# Patient Record
Sex: Male | Born: 1985 | Race: Black or African American | Hispanic: No | Marital: Single | Smoking: Former smoker
Health system: Southern US, Community
[De-identification: ages and names within clinical notes are randomized; demographics above are authoritative.]

## PROBLEM LIST (undated history)

## (undated) DIAGNOSIS — N189 Chronic kidney disease, unspecified: Secondary | ICD-10-CM

## (undated) DIAGNOSIS — K649 Unspecified hemorrhoids: Secondary | ICD-10-CM

## (undated) DIAGNOSIS — G40909 Epilepsy, unspecified, not intractable, without status epilepticus: Secondary | ICD-10-CM

## (undated) DIAGNOSIS — Z21 Asymptomatic human immunodeficiency virus [HIV] infection status: Secondary | ICD-10-CM

## (undated) HISTORY — DX: Unspecified hemorrhoids: K64.9

## (undated) HISTORY — DX: Asymptomatic human immunodeficiency virus (hiv) infection status: Z21

## (undated) HISTORY — DX: Epilepsy, unspecified, not intractable, without status epilepticus: G40.909

---

## 2019-05-11 ENCOUNTER — Other Ambulatory Visit: Payer: Self-pay | Admitting: General Surgery

## 2019-05-11 ENCOUNTER — Other Ambulatory Visit (HOSPITAL_COMMUNITY): Payer: Self-pay | Admitting: General Surgery

## 2019-05-28 ENCOUNTER — Encounter: Payer: Self-pay | Admitting: Neurology

## 2019-05-28 ENCOUNTER — Ambulatory Visit (INDEPENDENT_AMBULATORY_CARE_PROVIDER_SITE_OTHER): Payer: Commercial Managed Care - PPO | Admitting: Neurology

## 2019-05-28 ENCOUNTER — Other Ambulatory Visit: Payer: Self-pay

## 2019-05-28 VITALS — BP 150/94 | HR 88 | Ht 65.0 in | Wt 246.0 lb

## 2019-05-28 DIAGNOSIS — R351 Nocturia: Secondary | ICD-10-CM

## 2019-05-28 DIAGNOSIS — R0683 Snoring: Secondary | ICD-10-CM

## 2019-05-28 DIAGNOSIS — Z820 Family history of epilepsy and other diseases of the nervous system: Secondary | ICD-10-CM

## 2019-05-28 DIAGNOSIS — Z9189 Other specified personal risk factors, not elsewhere classified: Secondary | ICD-10-CM | POA: Diagnosis not present

## 2019-05-28 DIAGNOSIS — B20 Human immunodeficiency virus [HIV] disease: Secondary | ICD-10-CM

## 2019-05-28 DIAGNOSIS — Z6841 Body Mass Index (BMI) 40.0 and over, adult: Secondary | ICD-10-CM

## 2019-05-28 NOTE — Progress Notes (Signed)
Subjective:    Patient ID: Danel Requena is a 33 y.o. male.  HPI     Huston Foley, MD, PhD [facility] [address], [address] [address] [place], [place] [address]  Dear Dr. Sheliah Hatch,  I saw your patient, Rembert Browe, upon your kind request in my sleep clinic today for initial consultation of his, in particular, concern for underlying obstructive sleep apnea.  The patient is unaccompanied today.  As you know, Mr. Senters is a 33 year old right-handed gentleman with an underlying medical history of hypertension, HIV disease, and morbid obesity with a BMI of over 40, who reports snoring and a family history of obstructive sleep apnea.  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  He tries to achieve 7 to 8 hours of sleep.  His bedtime and rise time are variable.  He lives with a roommate.  He has significant nocturia, reports that he takes his blood pressure medication and hydrochlorothiazide at night.  He gets up to use the bathroom 4 or 5 times per average night.  I reviewed your office note from 05/09/2019, which you kindly included.  He is being evaluated for bariatric surgery.  His Epworth sleepiness score is 9 out of 24, fatigue severity score is 14 out of 63.  He denies morning headaches.  His father has sleep apnea and had a CPAP machine.  His maternal uncle has sleep apnea as well.  The patient is a non-smoker, drinks caffeine and limitation, coffee 1 cup and tea 1 cup generally speaking, alcohol occasionally in the form of wine.  He is single, no kids, no pets in the household.  He has a TV in his bedroom but turns it off at night.  His Past Medical History Is Significant For: Past Medical History:  Diagnosis Date  . Hemorrhoids   . HIV positive (HCC)   . Seizure disorder [facility])     His Past Surgical History Is Significant For: History reviewed. No pertinent surgical  history.  His Family History Is Significant For: Family History  Problem Relation Age of Onset  . Hypertension Mother   . Hypertension Father   . Diabetes Father     His Social History Is Significant For: Social History   Socioeconomic History  . Marital status: Single    Spouse name: Not on file  . Number of children: Not on file  . Years of education: Not on file  . Highest education level: Not on file  Occupational History  . Not on file  Social Needs  . Financial resource strain: Not on file  . Food insecurity    Worry: Not on file    Inability: Not on file  . Transportation needs    Medical: Not on file    Non-medical: Not on file  Tobacco Use  . Smoking status: Former Games developer  . Smokeless tobacco: Never Used  Substance and Sexual Activity  . Alcohol use: Yes  . Drug use: Not on file  . Sexual activity: Not on file  Lifestyle  . Physical activity    Days per week: Not on file    Minutes per session: Not on file  . Stress: Not on file  Relationships  . Social Musician on phone: Not on file    Gets together: Not on file    Attends religious service: Not on file  Active member of club or organization: Not on file    Attends meetings of clubs or organizations: Not on file    Relationship status: Not on file  Other Topics Concern  . Not on file  Social History Narrative  . Not on file    His Allergies Are:  Allergies  Allergen Reactions  . Bactrim [Sulfamethoxazole-Trimethoprim]   . Shellfish Allergy   :   His Current Medications Are:  Outpatient Encounter Medications as of 05/28/2019  Medication Sig  . bictegravir-emtricitabine-tenofovir AF (BIKTARVY) 50-200-25 MG TABS tablet Take 1 tablet by mouth daily.  . hydrochlorothiazide (HYDRODIURIL) 12.5 MG tablet Take 12.5 mg by mouth daily.  Marland Kitchen. lisinopril (ZESTRIL) 40 MG tablet Take 40 mg by mouth daily.   No facility-administered encounter medications on file as of 05/28/2019.   :  Review  of Systems:  Out of a complete 14 point review of systems, all are reviewed and negative with the exception of these symptoms as listed below: Review of Systems  Neurological:       Pt presents today to discuss his sleep. Pt has never had a sleep study but does endorse snoring.  Epworth Sleepiness Scale 0= would never doze 1= slight chance of dozing 2= moderate chance of dozing 3= high chance of dozing  Sitting and reading: 2 Watching TV: 1 Sitting inactive in a public place (ex. Theater or meeting): 0 As a passenger in a car for an hour without a break: 2 Lying down to rest in the afternoon: 3 Sitting and talking to someone: 0 Sitting quietly after lunch (no alcohol): 1 In a car, while stopped in traffic: 0 Total: 9     Objective:  Neurological Exam  Physical Exam Physical Examination:   Vitals:   05/28/19 0859  BP: (!) 150/94  Pulse: 88    General Examination: The patient is a very pleasant 33 y.o. male in no acute distress. He appears well-developed and well-nourished and well groomed.   HEENT: Normocephalic, atraumatic, pupils are equal, round and reactive to light.  He has corrective eyeglasses in place, extraocular tracking is well preserved, hearing is grossly intact.  Face is symmetric with normal facial animation. Speech is clear with no dysarthria noted. There is no hypophonia. There is no lip, neck/head, jaw or voice tremor. Neck is supple with full range of passive and active motion. There are no carotid bruits on auscultation. Oropharynx exam reveals: mild mouth dryness, good dental hygiene and moderate airway crowding, due to Tonsillar size of 2+, Mallampati is class I, uvula slightly larger, normal tongue, neck circumference 18-3/8 inches.  He has a minimal overbite.  Tongue protrudes centrally in palate elevates symmetrically.  Chest: Clear to auscultation without wheezing, rhonchi or crackles noted.  Heart: S1+S2+0, regular and normal without murmurs, rubs  or gallops noted.   Abdomen: Soft, non-tender and non-distended with normal bowel sounds appreciated on auscultation.  Extremities: There is no pitting edema in the distal lower extremities bilaterally.  Skin: Warm and dry without trophic changes noted.  Musculoskeletal: exam reveals no obvious joint deformities, tenderness or joint swelling or erythema.   Neurologically:  Mental status: The patient is awake, alert and oriented in all 4 spheres. His immediate and remote memory, attention, language skills and fund of knowledge are appropriate. There is no evidence of aphasia, agnosia, apraxia or anomia. Speech is clear with normal prosody and enunciation. Thought process is linear. Mood is normal and affect is normal.  Cranial nerves II - XII are  as described above under HEENT exam.  Motor exam: Normal bulk, strength and tone is noted. There is no tremor. Romberg is negative. Fine motor skills and coordination: grossly intact with normal finger taps, normal hand movements, normal rapid alternating patting, normal foot taps and normal foot agility.  Cerebellar testing: No dysmetria or intention tremor. There is no truncal or gait ataxia.  Sensory exam: intact to light touch in the upper and lower extremities.  Gait, station and balance: He stands easily. No veering to one side is noted. No leaning to one side is noted. Posture is age-appropriate and stance is narrow based. Gait shows normal stride length and normal pace. No problems turning are noted. Tandem walk is unremarkable.               Assessment and Plan:  In summary, Dom Haverland is a very pleasant 33 y.o.-year old male with an underlying medical history of hypertension, HIV disease, and morbid obesity with a BMI of over 40, whose history and physical exam are concerning for obstructive sleep apnea (OSA). I had a long chat with the patient about my findings and the diagnosis of OSA, its prognosis and treatment options. We talked about  medical treatments, surgical interventions and non-pharmacological approaches. I explained in particular the risks and ramifications of untreated moderate to severe OSA, especially with respect to developing cardiovascular disease down the Road, including congestive heart failure, difficult to treat hypertension, cardiac arrhythmias, or stroke. Even type 2 diabetes has, in part, been linked to untreated OSA. Symptoms of untreated OSA include daytime sleepiness, memory problems, mood irritability and mood disorder such as depression and anxiety, lack of energy, as well as recurrent headaches, especially morning headaches. We talked about trying to maintain a healthy lifestyle in general, as well as the importance of weight control. We also talked about the importance of good sleep hygiene. I recommended the following at this time: sleep study.   I explained the sleep test procedure to the patient and also outlined possible surgical and non-surgical treatment options of OSA (which would require a referral to a specialist dentist or oral surgeon), upper airway surgical options, such as traditional UPPP or a novel less invasive surgical option in the form of Inspire hypoglossal nerve stimulation (which would involve a referral to an ENT surgeon). I also explained the CPAP treatment option to the patient, who indicated that he would be willing to try CPAP if the need arises. I explained the importance of being compliant with PAP treatment, not only for insurance purposes but primarily to improve His symptoms, and for the patient's long term health benefit, including to reduce His cardiovascular risks. I answered all his questions today and the patient was in agreement. I plan to see him back after the sleep study is completed and encouraged him to call with any interim questions, concerns, problems or updates.   Thank you very much for allowing me to participate in the care of this nice patient. If I can be of any  further assistance to you please do not hesitate to call me at [phone].  Sincerely,   Star Age, MD, PhD

## 2019-05-28 NOTE — Patient Instructions (Signed)

## 2019-05-29 ENCOUNTER — Ambulatory Visit (HOSPITAL_COMMUNITY)
Admission: RE | Admit: 2019-05-29 | Discharge: 2019-05-29 | Disposition: A | Discharge status: Home / Self Care | Payer: Commercial Managed Care - PPO | Source: Ambulatory Visit | Attending: General Surgery | Admitting: General Surgery

## 2019-05-29 ENCOUNTER — Other Ambulatory Visit: Payer: Self-pay

## 2019-06-20 ENCOUNTER — Ambulatory Visit (INDEPENDENT_AMBULATORY_CARE_PROVIDER_SITE_OTHER): Payer: Commercial Managed Care - PPO | Admitting: Neurology

## 2019-06-20 ENCOUNTER — Encounter: Payer: Self-pay | Admitting: Dietician

## 2019-06-20 ENCOUNTER — Other Ambulatory Visit: Payer: Self-pay

## 2019-06-20 ENCOUNTER — Encounter: Payer: Commercial Managed Care - PPO | Attending: General Surgery | Admitting: Dietician

## 2019-06-20 DIAGNOSIS — B20 Human immunodeficiency virus [HIV] disease: Secondary | ICD-10-CM

## 2019-06-20 DIAGNOSIS — R0683 Snoring: Secondary | ICD-10-CM

## 2019-06-20 DIAGNOSIS — Z9189 Other specified personal risk factors, not elsewhere classified: Secondary | ICD-10-CM

## 2019-06-20 DIAGNOSIS — R351 Nocturia: Secondary | ICD-10-CM

## 2019-06-20 DIAGNOSIS — G4733 Obstructive sleep apnea (adult) (pediatric): Secondary | ICD-10-CM | POA: Diagnosis not present

## 2019-06-20 DIAGNOSIS — Z820 Family history of epilepsy and other diseases of the nervous system: Secondary | ICD-10-CM

## 2019-06-20 DIAGNOSIS — E669 Obesity, unspecified: Secondary | ICD-10-CM | POA: Diagnosis present

## 2019-06-20 NOTE — Progress Notes (Signed)
Nutrition Assessment for Bariatric Surgery Medical Nutrition Therapy  Appt Start Time: 11:00am    End Time: 11:45am  Patient was seen on 06/20/2019 for Pre-Operative Nutrition Assessment. Letter of approval faxed to [facility] bariatric surgery program coordinator on 06/20/2019.   Referral stated Supervised Weight Loss (SWL) visits needed: 0  Planned surgery: Sleeve  Pt expectation of surgery: to lower blood pressure, lose weight, and prevent future health problems  Pt expectation of dietitian: educate on making healthier food choices    NUTRITION ASSESSMENT   Anthropometrics  Start weight at [facility]: 246.5 lbs (date: 06/20/2019) Height: 65 in BMI: 41 kg/m2    Clinical  Medical hx: obesity, HTN, hemorrhoids, HIV, seizure disorder Medications: biktarvy, lisinopril, hydrochlorothiazide     Lifestyle & Dietary Hx Patient works as a Catering manager. Lives with his significant other. States he has always been "husky" or above average for weight despite being active when younger, and would like to prevent future health complications (such as diabetes which is in his family.) Mentioned he has a brother and sister.   Typical meal pattern is 3 meals plus snacks every day. Likes sweets (especially airplane cookies) and potato chips. States he would like to completely change his eating habits and diet. As a flight attendant, lunch/dinner is usually something from an airport (fast food such as burger and fries) or ordering pizza in a hotel. States he has seen other people be successful with this surgery and eating healthy as a flight attendant and he would like to work on this such as meal prepping.   24-Hr Dietary Recall First Meal: oatmeal (or grits) + tea  Snack: potato chips  Second Meal: airport meal   Snack: airplane cookies  Third Meal: pizza  Snack: sweets  Beverages: tea, water, used to drink soda and juice   Estimated Energy Needs Calories: 2000 Carbohydrate:  225g Protein: 125g Fat: 67g   NUTRITION DIAGNOSIS  Overweight/obesity ([place]-3.3) related to past poor dietary habits and physical inactivity as evidenced by patient w/ planned Sleeve Gastrectomy surgery following dietary guidelines for continued weight loss.    NUTRITION INTERVENTION  Nutrition counseling (C-1) and education (E-2) to facilitate bariatric surgery goals.  Pre-Op Goals Reviewed with the Patient . Track food and beverage intake (pen and paper, MyFitness Pal, Baritastic app, etc.) . Make healthy food choices while monitoring portion sizes . Consume 3 meals per day or try to eat every 3-5 hours . Avoid concentrated sugars and fried foods . Keep sugar & fat in the single digits per serving on food labels . Practice CHEWING your food (aim for applesauce consistency) . Practice not drinking 15 minutes before, during, and 30 minutes after each meal and snack . Avoid all carbonated beverages (ex: soda, sparkling beverages)  . Limit caffeinated beverages (ex: coffee, tea, energy drinks) . Avoid all sugar-sweetened beverages (ex: regular soda, sports drinks)  . Avoid alcohol  . Aim for 64-100 ounces of FLUID daily (with at least half of fluid intake being plain water)  . Aim for at least 60-80 grams of PROTEIN daily . Look for a liquid protein source that contains ?15 g protein and ?5 g carbohydrate (ex: shakes, drinks, shots) . Make a list of non-food related activities . Physical activity is an important part of a healthy lifestyle so keep it moving! The goal is to reach 150 minutes of exercise per week, including cardiovascular and weight baring activity.  Handouts Provided Include  . Bariatric Surgery handouts (Nutrition Visits, Pre-Op Goals,  Protein Shakes, Vitamins & Minerals)  Learning Style & Readiness for Change Teaching method utilized: Visual & Auditory  Demonstrated degree of understanding via: Teach Back  Barriers to learning/adherence to lifestyle change: None  Identified   MONITORING & EVALUATION Dietary intake, weekly physical activity, body weight, and pre-op goals reached at next nutrition visit.   Next Steps Patient is to call [facility] to enroll in Pre-Op Class (>2 weeks before surgery) and Post-Op Class (2 weeks after surgery) for further nutrition education when surgery date is scheduled.

## 2019-06-21 ENCOUNTER — Encounter: Payer: Self-pay | Admitting: Neurology

## 2019-07-05 ENCOUNTER — Telehealth: Payer: Self-pay

## 2019-07-05 NOTE — Progress Notes (Signed)
Patient referred by Dr. Kieth Brightly, seen by me on 05/28/19, HST on 06/20/19:  Please call and notify the patient that the recent home sleep test did suggest the diagnosis of severe obstructive sleep apnea and that I recommend treatment for this in the form of CPAP. I will request an overnight sleep study for proper titration and mask fitting. Please explain to patient.  Ethan Age, MD, PhD [facility])

## 2019-07-05 NOTE — Addendum Note (Signed)
Addended by: Star Age on: 07/05/2019 01:20 PM   Modules accepted: Orders

## 2019-07-05 NOTE — Telephone Encounter (Signed)
-----   Message from Star Age, MD sent at 07/05/2019  1:20 PM EST ----- Patient referred by Dr. Kieth Brightly, seen by me on 05/28/19, HST on 06/20/19:  Please call and notify the patient that the recent home sleep test did suggest the diagnosis of severe obstructive sleep apnea and that I recommend treatment for this in the form of CPAP. I will request an overnight sleep study for proper titration and mask fitting. Please explain to patient.  Star Age, MD, PhD [facility])

## 2019-07-05 NOTE — Procedures (Signed)
[facility] [place]  Patient Information     First Name: Ethan Hansen Last Name: Tramell Piechota: [id number]  Birth Date: [birth year] Age: 33 Gender: Male  Referring Provider: Mickeal Skinner, MD BMI: 41.1 (W=247 lb, H=5' 5'')  Neck Circ.:  18 '' Epworth:  9/24   Sleep Study Information    Study Date: 06/20/2019 S/H/A Version: 001.001.001.001 / 4.1.1528 / 79  History:    33 year old man with a history of hypertension, HIV disease, and morbid obesity with a BMI of over 40, who reports snoring and a family history of obstructive sleep apnea.  Summary & Diagnosis:    Severe OSA  Recommendations:      This home sleep test demonstrates severe obstructive sleep apnea with a total AHI of 52.8/hour and O2 nadir of 72%. Given the patient's medical history and sleep related complaints, treatment with positive airway pressure (in the form of CPAP) is recommended. This will require a full night CPAP titration study for proper treatment settings, O2 monitoring and mask fitting. Based on the severity of the sleep disordered breathing an attended titration study is indicated. Please note that untreated obstructive sleep apnea may carry additional perioperative morbidity. Patients with significant obstructive sleep apnea should receive perioperative PAP therapy and the surgeons and particularly the anesthesiologist should be informed of the diagnosis and the severity of the sleep disordered breathing. The patient should be cautioned not to drive, work at heights, or operate dangerous or heavy equipment when tired or sleepy. Review and reiteration of good sleep hygiene measures should be pursued with any patient. Other causes of the patient's symptoms, including circadian rhythm disturbances, an underlying mood disorder, medication effect and/or an underlying medical problem cannot be ruled out based on this test. Clinical correlation is recommended. The patient and his referring provider will be notified of the test  results. The patient will be seen in follow up in sleep clinic at [facility].  I certify that I have reviewed the raw data recording prior to the issuance of this report in accordance with the standards of the American Academy of Sleep Medicine (AASM).  Star Age, MD, PhD [facility]) Diplomat, ABPN (Neurology and Sleep)                             Sleep Summary    Oxygen Saturation Statistics     Start Study Time: End Study Time: Total Recording Time:  9:36:50 PM 7:40:51 AM 10 h, 4 min  Total Sleep Time % REM of Sleep Time:  8 h, 13 min  31.2    Mean: 98 Minimum: 72 Maximum: 100  Mean of Desaturations Nadirs (%):   93  Oxygen Desaturation. %:   4-9 10-20 >20 Total  Events Number Total   214  11 93.4 4.8  4 1.7  229 100.0  Oxygen Saturation <90 <=88 <85 <80 <70  Duration (minutes): Sleep % 3.2 0.6  2.3 1.0  0.5 0.2 0.2 0.0 0.0 0.0     Respiratory Indices      Total Events REM NREM All Night  pRDI:  494  pAHI:  433 ODI:  229  pAHIc:  20  % CSR: 0.0 74.2 68.3 47.9 3.9 54.0 45.8 18.9 1.8 60.3 52.8 27.9 2.4       Pulse Rate Statistics during Sleep (BPM)      Mean:  67 Minimum: 40 Maximum: 162    Indices are calculated using technically  valid sleep time of  8 hrs, 11 min. pRDI/pAHI are calculated using oxi desaturations ? 3%                      Body Position Statistics  Position Supine Prone Right Left Non-Supine  Sleep (min) 183.0 48.0 249.0 12.5 309.5  Sleep % 37.1 9.7 50.5 2.5 62.7  pRDI 59.5 64.7 62.3 19.2 60.9  pAHI 51.6 64.7 53.3 19.2 53.7  ODI 29.6 34.3 27.0 0.0 27.0     Snoring Statistics Snoring Level (dB) >40 >50 >60 >70 >80 >Threshold (45)  Sleep (min) 368.0 23.5 2.5 0.0 0.0 88.7  Sleep % 74.6 4.8 0.5 0.0 0.0 18.0    Mean: 43 dB Sleep Stages Chart                                                                                pAHI=52.8                                                                                             Mild              Moderate                    Severe                                                 5              15                    30  * Reference values are according to AASM guidelines

## 2019-07-05 NOTE — Telephone Encounter (Signed)

## 2019-08-13 ENCOUNTER — Other Ambulatory Visit: Payer: Self-pay

## 2019-08-13 ENCOUNTER — Encounter: Payer: Commercial Managed Care - PPO | Attending: General Surgery | Admitting: Skilled Nursing Facility1

## 2019-08-13 DIAGNOSIS — E669 Obesity, unspecified: Secondary | ICD-10-CM | POA: Insufficient documentation

## 2019-08-13 NOTE — Progress Notes (Signed)
Pre-Operative Nutrition Class:  Appt start time: 2122   End time:  1830.  Patient was seen on 08/13/2019 for Pre-Operative Bariatric [facility]   Surgery date:  Surgery type: sleeve Start weight at [facility]: 246.5 Weight today: 246.7  Samples given per MNT protocol. Patient educated on appropriate usage: procare Multivitamin Lot # [id number] Exp: 05/22  The following the learning objectives were met by the patient during this course:  Identify Pre-Op Dietary Goals and will begin 2 weeks pre-operatively  Identify appropriate sources of fluids and proteins   State protein recommendations and appropriate sources pre and post-operatively  Identify Post-Operative Dietary Goals and will follow for 2 weeks post-operatively  Identify appropriate multivitamin and calcium sources  Describe the need for physical activity post-operatively and will follow MD recommendations  State when to call healthcare provider regarding medication questions or post-operative complications  Handouts given during class include:  Pre-Op Bariatric Surgery Diet Handout  Protein Shake Handout  Post-Op Bariatric Surgery Nutrition Handout  [facility] Program Information Flyer  Support Group Information Flyer  [facility] Bariatric Supplements Price List  Follow-Up Plan: Patient will follow-up at [facility] 2 weeks post operatively for diet advancement per MD.

## 2019-08-25 ENCOUNTER — Other Ambulatory Visit (HOSPITAL_COMMUNITY): Payer: Commercial Managed Care - PPO

## 2019-09-03 ENCOUNTER — Ambulatory Visit: Payer: Self-pay | Admitting: General Surgery

## 2019-09-10 ENCOUNTER — Inpatient Hospital Stay: Admit: 2019-09-10 | Payer: Commercial Managed Care - PPO | Admitting: General Surgery

## 2019-09-10 SURGERY — GASTRECTOMY, SLEEVE, LAPAROSCOPIC
Anesthesia: General

## 2019-10-09 ENCOUNTER — Ambulatory Visit: Payer: Self-pay | Admitting: General Surgery

## 2019-10-19 NOTE — Patient Instructions (Signed)
DUE TO COVID-19 ONLY ONE VISITOR IS ALLOWED TO COME WITH YOU AND STAY IN THE WAITING ROOM ONLY DURING PRE OP AND PROCEDURE DAY OF SURGERY. THE 1 VISITOR MAY VISIT WITH YOU AFTER SURGERY IN YOUR PRIVATE ROOM DURING VISITING HOURS ONLY!  YOU NEED TO HAVE A COVID 19 TEST ON_3/5______ @_2 :55 ( they close at 3pm)______, THIS TEST MUST BE DONE BEFORE SURGERY, COME  [address] , [id number].  ([place]) ONCE YOUR COVID TEST IS COMPLETED, PLEASE BEGIN THE QUARANTINE INSTRUCTIONS AS OUTLINED IN YOUR HANDOUT.                Ethan Hansen    Your procedure is scheduled on: 10/30/19   Report to [facility] Main  Entrance   Report to Short Stay at 5:30  AM     Call this number if you have problems the morning of surgery [place], NO CHEWING GUM [place].   Do not eat food After Midnight.   YOU MAY HAVE CLEAR LIQUIDS FROM MIDNIGHT UNTIL 4:30AM.   At 4:30AM Please finish the prescribed Pre-Surgery Gatorade drink.   Nothing by mouth after you finish the Gatorade drink !  Take these medicines the morning of surgery with A SIP OF WATER:   DO NOT TAKE ANY DIABETIC MEDICATIONS DAY OF YOUR SURGERY: Biktarvy. Bring your mask and tubing to the hospital with you.                               You may not have any metal on your body including             piercings  Do not wear jewelry, lotions, powders or deodorant                     Men may shave face and neck.   Do not bring valuables to the hospital. [place].  Contacts, dentures or bridgework may not be worn into surgery.     Special Instructions: N/A              Please read over the following fact sheets you were given: _____________________________________________________________________             [facility] - Preparing for Surgery  Before surgery, you can play an  important role .  Because skin is not sterile, your skin needs to be as free of germs as possible.   You can reduce the number of germs on your skin by washing with CHG (chlorahexidine gluconate) soap before surgery.   CHG is an antiseptic cleaner which kills germs and bonds with the skin to continue killing germs even after washing. Please DO NOT use if you have an allergy to CHG or antibacterial soaps.   If your skin becomes reddened/irritated stop using the CHG and inform your nurse when you arrive at Short Stay.   You may shave your face/neck.  Please follow these instructions carefully:  1.  Shower with CHG Soap the night before surgery and the  morning of Surgery.  2.  If you choose to wash your hair, wash your hair first as usual with your  normal  shampoo.  3.  After you shampoo,  rinse your hair and body thoroughly to remove the  shampoo.                                        4.  Use CHG as you would any other liquid soap.  You can apply chg directly  to the skin and wash                       Gently with a scrungie or clean washcloth.  5.  Apply the CHG Soap to your body ONLY FROM THE NECK DOWN.   Do not use on face/ open                           Wound or open sores. Avoid contact with eyes, ears mouth and genitals (private parts).                       Wash face,  Genitals (private parts) with your normal soap.             6.  Wash thoroughly, paying special attention to the area where your surgery  will be performed.  7.  Thoroughly rinse your body with warm water from the neck down.  8.  DO NOT shower/wash with your normal soap after using and rinsing off  the CHG Soap.             9.  Pat yourself dry with a clean towel.            10.  Wear clean pajamas.            11.  Place clean sheets on your bed the night of your first shower and do not  sleep with pets. Day of Surgery : Do not apply any lotions/deodorants the morning of surgery.  Please wear clean clothes to the  hospital/surgery center.  FAILURE TO FOLLOW THESE INSTRUCTIONS MAY RESULT IN THE CANCELLATION OF YOUR SURGERY PATIENT SIGNATURE_________________________________  NURSE SIGNATURE__________________________________  ________________________________________________________________________   Ethan Hansen  An incentive spirometer is a tool that can help keep your lungs clear and active. This tool measures how well you are filling your lungs with each breath. Taking long deep breaths may help reverse or decrease the chance of developing breathing (pulmonary) problems (especially infection) following:  A long period of time when you are unable to move or be active. BEFORE THE PROCEDURE   If the spirometer includes an indicator to show your best effort, your nurse or respiratory therapist will set it to a desired goal.  If possible, sit up straight or lean slightly forward. Try not to slouch.  Hold the incentive spirometer in an upright position. INSTRUCTIONS FOR USE  1. Sit on the edge of your bed if possible, or sit up as far as you can in bed or on a chair. 2. Hold the incentive spirometer in an upright position. 3. Breathe out normally. 4. Place the mouthpiece in your mouth and seal your lips tightly around it. 5. Breathe in slowly and as deeply as possible, raising the piston or the ball toward the top of the column. 6. Hold your breath for 3-5 seconds or for as long as possible. Allow the piston or ball to fall to the bottom of the column. 7. Remove the mouthpiece from your mouth and  breathe out normally. 8. Rest for a few seconds and repeat Steps 1 through 7 at least 10 times every 1-2 hours when you are awake. Take your time and take a few normal breaths between deep breaths. 9. The spirometer may include an indicator to show your best effort. Use the indicator as a goal to work toward during each repetition. 10. After each set of 10 deep breaths, practice coughing to be sure  your lungs are clear. If you have an incision (the cut made at the time of surgery), support your incision when coughing by placing a pillow or rolled up towels firmly against it. Once you are able to get out of bed, walk around indoors and cough well. You may stop using the incentive spirometer when instructed by your caregiver.  RISKS AND COMPLICATIONS  Take your time so you do not get dizzy or light-headed.  If you are in pain, you may need to take or ask for pain medication before doing incentive spirometry. It is harder to take a deep breath if you are having pain. AFTER USE  Rest and breathe slowly and easily.  It can be helpful to keep track of a log of your progress. Your caregiver can provide you with a simple table to help with this. If you are using the spirometer at home, follow these instructions: SEEK MEDICAL CARE IF:   You are having difficultly using the spirometer.  You have trouble using the spirometer as often as instructed.  Your pain medication is not giving enough relief while using the spirometer.  You develop fever of 100.5 F (38.1 C) or higher. SEEK IMMEDIATE MEDICAL CARE IF:   You cough up bloody sputum that had not been present before.  You develop fever of 102 F (38.9 C) or greater.  You develop worsening pain at or near the incision site. MAKE SURE YOU:   Understand these instructions.  Will watch your condition.  Will get help right away if you are not doing well or get worse. Document Released: 12/20/2006 Document Revised: 11/01/2011 Document Reviewed: 02/20/2007 [facility] Patient Information 2014 [place], [place].   ________________________________________________________________________

## 2019-10-22 ENCOUNTER — Encounter (HOSPITAL_COMMUNITY)
Admission: RE | Admit: 2019-10-22 | Discharge: 2019-10-22 | Disposition: A | Discharge status: Home / Self Care | Payer: Commercial Managed Care - PPO | Source: Ambulatory Visit | Attending: General Surgery | Admitting: General Surgery

## 2019-10-23 ENCOUNTER — Encounter (HOSPITAL_COMMUNITY): Payer: Self-pay

## 2019-10-23 ENCOUNTER — Encounter (HOSPITAL_COMMUNITY)
Admission: RE | Admit: 2019-10-23 | Discharge: 2019-10-23 | Disposition: A | Discharge status: Home / Self Care | Payer: Commercial Managed Care - PPO | Source: Ambulatory Visit | Attending: General Surgery | Admitting: General Surgery

## 2019-10-23 ENCOUNTER — Other Ambulatory Visit: Payer: Self-pay

## 2019-10-23 ENCOUNTER — Encounter (HOSPITAL_COMMUNITY): Payer: Commercial Managed Care - PPO

## 2019-10-23 DIAGNOSIS — I129 Hypertensive chronic kidney disease with stage 1 through stage 4 chronic kidney disease, or unspecified chronic kidney disease: Secondary | ICD-10-CM | POA: Insufficient documentation

## 2019-10-23 DIAGNOSIS — Z6841 Body Mass Index (BMI) 40.0 and over, adult: Secondary | ICD-10-CM | POA: Insufficient documentation

## 2019-10-23 DIAGNOSIS — Z01818 Encounter for other preprocedural examination: Secondary | ICD-10-CM | POA: Insufficient documentation

## 2019-10-23 DIAGNOSIS — G40909 Epilepsy, unspecified, not intractable, without status epilepticus: Secondary | ICD-10-CM | POA: Insufficient documentation

## 2019-10-23 DIAGNOSIS — Z79899 Other long term (current) drug therapy: Secondary | ICD-10-CM | POA: Diagnosis not present

## 2019-10-23 DIAGNOSIS — N189 Chronic kidney disease, unspecified: Secondary | ICD-10-CM | POA: Insufficient documentation

## 2019-10-23 DIAGNOSIS — Z21 Asymptomatic human immunodeficiency virus [HIV] infection status: Secondary | ICD-10-CM | POA: Diagnosis not present

## 2019-10-23 DIAGNOSIS — Z87891 Personal history of nicotine dependence: Secondary | ICD-10-CM | POA: Diagnosis not present

## 2019-10-23 HISTORY — DX: Chronic kidney disease, unspecified: N18.9

## 2019-10-23 LAB — COMPREHENSIVE METABOLIC PANEL
ALT: 25 U/L (ref 0–44)
AST: 24 U/L (ref 15–41)
Albumin: 4.2 g/dL (ref 3.5–5.0)
Alkaline Phosphatase: 85 U/L (ref 38–126)
Anion gap: 8 (ref 5–15)
BUN: 14 mg/dL (ref 6–20)
CO2: 26 mmol/L (ref 22–32)
Calcium: 9.2 mg/dL (ref 8.9–10.3)
Chloride: 102 mmol/L (ref 98–111)
Creatinine, Ser: 1.55 mg/dL — ABNORMAL HIGH (ref 0.61–1.24)
GFR calc Af Amer: >60 mL/min (ref >60)
GFR calc non Af Amer: 58 mL/min — ABNORMAL LOW (ref >60)
Glucose, Bld: 103 mg/dL — ABNORMAL HIGH (ref 70–99)
Potassium: 4 mmol/L (ref 3.5–5.1)
Sodium: 136 mmol/L (ref 135–145)
Total Bilirubin: 0.4 mg/dL (ref 0.3–1.2)
Total Protein: 8.6 g/dL — ABNORMAL HIGH (ref 6.5–8.1)

## 2019-10-23 LAB — CBC WITH DIFFERENTIAL/PLATELET
Abs Immature Granulocytes: 0.02 10*3/uL (ref 0.00–0.07)
Basophils Absolute: 0 10*3/uL (ref 0.0–0.1)
Basophils Relative: 1 %
Eosinophils Absolute: 0.3 10*3/uL (ref 0.0–0.5)
Eosinophils Relative: 5 %
HCT: 51 % (ref 39.0–52.0)
Hemoglobin: 16.2 g/dL (ref 13.0–17.0)
Immature Granulocytes: 0 %
Lymphocytes Relative: 36 %
Lymphs Abs: 2 10*3/uL (ref 0.7–4.0)
MCH: 26.8 pg (ref 26.0–34.0)
MCHC: 31.8 g/dL (ref 30.0–36.0)
MCV: 84.4 fL (ref 80.0–100.0)
Monocytes Absolute: 0.6 10*3/uL (ref 0.1–1.0)
Monocytes Relative: 10 %
Neutro Abs: 2.7 10*3/uL (ref 1.7–7.7)
Neutrophils Relative %: 48 %
Platelets: 259 10*3/uL (ref 150–400)
RBC: 6.04 MIL/uL — ABNORMAL HIGH (ref 4.22–5.81)
RDW: 15.2 % (ref 11.5–15.5)
WBC: 5.5 10*3/uL (ref 4.0–10.5)
nRBC: 0 % (ref 0.0–0.2)

## 2019-10-23 LAB — ABO/RH: ABO/RH(D): O POS

## 2019-10-23 NOTE — Progress Notes (Signed)
PCP -  Cardiologist -   Chest x-ray - 05/29/19 EKG - 05/29/19 Stress Test -  ECHO -  Cardiac Cath -   Sleep Study -  CPAP -   Fasting Blood Sugar -  Checks Blood Sugar _____ times a day  Blood Thinner Instructions: Aspirin Instructions: Last Dose:  Anesthesia review:   Patient denies shortness of breath, fever, cough and chest pain at PAT appointment   Patient verbalized understanding of instructions that were given to them at the PAT appointment. Patient was also instructed that they will need to review over the PAT instructions again at home before surgery.

## 2019-10-24 NOTE — Progress Notes (Signed)
Anesthesia Chart Review   Case: [id number] Date/Time: 10/30/19 0715   Procedure: LAPAROSCOPIC GASTRIC SLEEVE RESECTION, UPPER ENDO, ERAS Pathway (N/A )   Anesthesia type: General   Pre-op diagnosis: Morbid Obesity, HTN   Location: [facility] ROOM 01 / [facility]   Surgeons: Kinsinger, De Blanch, MD      DISCUSSION:33 y.o. former smoker with h/o CKD, seizure disorder (remote h/o 2 events, normal EEG), HIV positive, morbid obesity, HTN scheduled for above procedure 10/30/19 with Dr. Feliciana Rossetti.    Elevated BP at PAT visit, 146/113 and on recheck 148/107.  Pt reports he does not have a PCP in this area.  Currently taking hydrochlorothiazide.  Asymptomatic today.  Advised to be seen at UC/Family Med.  Pt reports he will be seen today for uncontrolled HTN.  Evaluate DOS.   VS: There were no vitals taken for this visit.  PROVIDERS: Patient, No Pcp Per   LABS: Labs reviewed: Acceptable for surgery. (all labs ordered are listed, but only abnormal results are displayed)  Labs Reviewed - No data to display   IMAGES:   EKG: 05/29/2019 Rate 93 bpm  Normal sinus rhythm   CV:  Past Medical History:  Diagnosis Date  . Chronic kidney disease   . Hemorrhoids   . HIV positive (HCC)   . Seizure disorder [facility])     History reviewed. No pertinent surgical history.  MEDICATIONS: . bictegravir-emtricitabine-tenofovir AF (BIKTARVY) 50-200-25 MG TABS tablet  . hydrochlorothiazide (HYDRODIURIL) 12.5 MG tablet  . lisinopril (ZESTRIL) 40 MG tablet  . Multiple Vitamins-Minerals (AIRBORNE PO)  . Multiple Vitamins-Minerals (EMERGEN-C IMMUNE PO)  . zinc gluconate 50 MG tablet   No current facility-administered medications for this encounter.   Janey Genta [facility] Pre-Surgical Testing [phone] 10/24/19  2:53 PM

## 2019-10-26 ENCOUNTER — Other Ambulatory Visit (HOSPITAL_COMMUNITY)
Admission: RE | Admit: 2019-10-26 | Discharge: 2019-10-26 | Disposition: A | Discharge status: Home / Self Care | Payer: Commercial Managed Care - PPO | Source: Ambulatory Visit | Attending: General Surgery | Admitting: General Surgery

## 2019-10-26 DIAGNOSIS — Z20822 Contact with and (suspected) exposure to covid-19: Secondary | ICD-10-CM | POA: Insufficient documentation

## 2019-10-26 DIAGNOSIS — Z01812 Encounter for preprocedural laboratory examination: Secondary | ICD-10-CM | POA: Insufficient documentation

## 2019-10-27 LAB — SARS CORONAVIRUS 2 (TAT 6-24 HRS): SARS Coronavirus 2: NEGATIVE

## 2019-10-29 MED ORDER — BUPIVACAINE LIPOSOME 1.3 % IJ SUSP
20.0000 mL | INTRAMUSCULAR | Status: DC
  Filled 2019-10-29: qty 20

## 2019-10-29 NOTE — Anesthesia Preprocedure Evaluation (Addendum)
Anesthesia Evaluation  Patient identified by MRN, date of birth, ID band Patient awake    Reviewed: Allergy & Precautions, NPO status , Patient's Chart, lab work & pertinent test results  Airway Mallampati: II  TM Distance: >3 FB Neck ROM: Full    Dental no notable dental hx.    Pulmonary former smoker,    Pulmonary exam normal breath sounds clear to auscultation       Cardiovascular hypertension, Normal cardiovascular exam Rhythm:Regular Rate:Normal  ECG: NSR, rate 93   Neuro/Psych Seizures -, Well Controlled,  negative psych ROS   GI/Hepatic negative GI ROS, Neg liver ROS,   Endo/Other  Morbid obesity  Renal/GU Renal InsufficiencyRenal disease     Musculoskeletal negative musculoskeletal ROS (+)   Abdominal (+) + obese,   Peds  Hematology  (+) HIV,   Anesthesia Other Findings Morbid Obesity  Reproductive/Obstetrics                            Anesthesia Physical Anesthesia Plan  ASA: III  Anesthesia Plan: General   Post-op Pain Management:    Induction: Intravenous  PONV Risk Score and Plan: 3 and Ondansetron, Dexamethasone, Midazolam and Treatment may vary due to age or medical condition  Airway Management Planned: Oral ETT  Additional Equipment:   Intra-op Plan:   Post-operative Plan: Extubation in OR  Informed Consent: I have reviewed the patients History and Physical, chart, labs and discussed the procedure including the risks, benefits and alternatives for the proposed anesthesia with the patient or authorized representative who has indicated his/her understanding and acceptance.     Dental advisory given  Plan Discussed with: CRNA  Anesthesia Plan Comments:        Anesthesia Quick Evaluation

## 2019-10-30 ENCOUNTER — Inpatient Hospital Stay (HOSPITAL_COMMUNITY): Payer: Commercial Managed Care - PPO | Admitting: Anesthesiology

## 2019-10-30 ENCOUNTER — Other Ambulatory Visit: Payer: Self-pay

## 2019-10-30 ENCOUNTER — Encounter (HOSPITAL_COMMUNITY)
Admission: RE | Disposition: A | Discharge status: Home / Self Care | Payer: Self-pay | Source: Home / Self Care | Attending: General Surgery

## 2019-10-30 ENCOUNTER — Inpatient Hospital Stay (HOSPITAL_COMMUNITY): Payer: Commercial Managed Care - PPO | Admitting: Physician Assistant

## 2019-10-30 ENCOUNTER — Encounter (HOSPITAL_COMMUNITY): Payer: Self-pay | Admitting: General Surgery

## 2019-10-30 ENCOUNTER — Inpatient Hospital Stay (HOSPITAL_COMMUNITY)
Admission: RE | Admit: 2019-10-30 | Discharge: 2019-10-31 | DRG: 621 | Disposition: A | Discharge status: Home / Self Care | Payer: Commercial Managed Care - PPO | Attending: General Surgery | Admitting: General Surgery

## 2019-10-30 DIAGNOSIS — G40909 Epilepsy, unspecified, not intractable, without status epilepticus: Secondary | ICD-10-CM | POA: Diagnosis present

## 2019-10-30 DIAGNOSIS — I129 Hypertensive chronic kidney disease with stage 1 through stage 4 chronic kidney disease, or unspecified chronic kidney disease: Secondary | ICD-10-CM | POA: Diagnosis present

## 2019-10-30 DIAGNOSIS — Z881 Allergy status to other antibiotic agents status: Secondary | ICD-10-CM

## 2019-10-30 DIAGNOSIS — Z79899 Other long term (current) drug therapy: Secondary | ICD-10-CM | POA: Diagnosis not present

## 2019-10-30 DIAGNOSIS — Z6841 Body Mass Index (BMI) 40.0 and over, adult: Secondary | ICD-10-CM | POA: Diagnosis not present

## 2019-10-30 DIAGNOSIS — N182 Chronic kidney disease, stage 2 (mild): Secondary | ICD-10-CM | POA: Diagnosis present

## 2019-10-30 DIAGNOSIS — Z87891 Personal history of nicotine dependence: Secondary | ICD-10-CM | POA: Diagnosis not present

## 2019-10-30 DIAGNOSIS — E669 Obesity, unspecified: Secondary | ICD-10-CM | POA: Diagnosis present

## 2019-10-30 DIAGNOSIS — Z21 Asymptomatic human immunodeficiency virus [HIV] infection status: Secondary | ICD-10-CM | POA: Diagnosis present

## 2019-10-30 DIAGNOSIS — Z87892 Personal history of anaphylaxis: Secondary | ICD-10-CM

## 2019-10-30 DIAGNOSIS — Z8249 Family history of ischemic heart disease and other diseases of the circulatory system: Secondary | ICD-10-CM

## 2019-10-30 DIAGNOSIS — Z91013 Allergy to seafood: Secondary | ICD-10-CM | POA: Diagnosis not present

## 2019-10-30 HISTORY — PX: LAPAROSCOPIC GASTRIC SLEEVE RESECTION: SHX5895

## 2019-10-30 LAB — TYPE AND SCREEN
ABO/RH(D): O POS
Antibody Screen: NEGATIVE

## 2019-10-30 LAB — SURGICAL PATHOLOGY

## 2019-10-30 LAB — HEMOGLOBIN AND HEMATOCRIT, BLOOD
HCT: 46.4 % (ref 39.0–52.0)
Hemoglobin: 15 g/dL (ref 13.0–17.0)

## 2019-10-30 SURGERY — GASTRECTOMY, SLEEVE, LAPAROSCOPIC
Anesthesia: General | Site: Abdomen

## 2019-10-30 MED ORDER — DEXTROSE-NACL 5-0.45 % IV SOLN: INTRAVENOUS | Status: DC

## 2019-10-30 MED ORDER — DEXAMETHASONE SODIUM PHOSPHATE 10 MG/ML IJ SOLN
INTRAMUSCULAR | Status: AC
  Filled 2019-10-30: qty 1

## 2019-10-30 MED ORDER — OXYCODONE HCL 5 MG/5ML PO SOLN
5.0000 mg | Freq: Four times a day (QID) | ORAL | Status: DC | PRN
  Administered 2019-10-30 – 2019-10-31 (×3): 5 mg via ORAL
  Filled 2019-10-30 (×3): qty 5

## 2019-10-30 MED ORDER — KETAMINE HCL 10 MG/ML IJ SOLN
INTRAMUSCULAR | Status: DC | PRN
  Administered 2019-10-30: 30 mg via INTRAVENOUS

## 2019-10-30 MED ORDER — PHENYLEPHRINE 40 MCG/ML (10ML) SYRINGE FOR IV PUSH (FOR BLOOD PRESSURE SUPPORT)
PREFILLED_SYRINGE | INTRAVENOUS | Status: AC
  Filled 2019-10-30: qty 10

## 2019-10-30 MED ORDER — GABAPENTIN 100 MG PO CAPS
200.0000 mg | ORAL_CAPSULE | Freq: Two times a day (BID) | ORAL | Status: DC
  Administered 2019-10-30 – 2019-10-31 (×3): 200 mg via ORAL
  Filled 2019-10-30 (×3): qty 2

## 2019-10-30 MED ORDER — PROPOFOL 10 MG/ML IV BOLUS
INTRAVENOUS | Status: DC | PRN
  Administered 2019-10-30: 200 mg via INTRAVENOUS

## 2019-10-30 MED ORDER — ONDANSETRON HCL 4 MG/2ML IJ SOLN
4.0000 mg | INTRAMUSCULAR | Status: DC | PRN
  Administered 2019-10-30: 4 mg via INTRAVENOUS
  Filled 2019-10-30: qty 2

## 2019-10-30 MED ORDER — FENTANYL CITRATE (PF) 250 MCG/5ML IJ SOLN
INTRAMUSCULAR | Status: AC
  Filled 2019-10-30: qty 5

## 2019-10-30 MED ORDER — ACETAMINOPHEN 500 MG PO TABS: 1000.0000 mg | ORAL_TABLET | Freq: Three times a day (TID) | ORAL | Status: DC

## 2019-10-30 MED ORDER — PHENYLEPHRINE 40 MCG/ML (10ML) SYRINGE FOR IV PUSH (FOR BLOOD PRESSURE SUPPORT)
PREFILLED_SYRINGE | INTRAVENOUS | Status: DC | PRN
  Administered 2019-10-30 (×2): 120 ug via INTRAVENOUS
  Administered 2019-10-30: 200 ug via INTRAVENOUS
  Administered 2019-10-30 (×2): 80 ug via INTRAVENOUS

## 2019-10-30 MED ORDER — OXYCODONE HCL 5 MG/5ML PO SOLN: 5.0000 mg | Freq: Once | ORAL | Status: DC | PRN

## 2019-10-30 MED ORDER — SODIUM CHLORIDE 0.9 % IV SOLN
2.0000 g | INTRAVENOUS | Status: AC
  Administered 2019-10-30: 2 g via INTRAVENOUS
  Filled 2019-10-30: qty 2

## 2019-10-30 MED ORDER — MIDAZOLAM HCL 5 MG/5ML IJ SOLN
INTRAMUSCULAR | Status: DC | PRN
  Administered 2019-10-30: 2 mg via INTRAVENOUS

## 2019-10-30 MED ORDER — SODIUM CHLORIDE (PF) 0.9 % IJ SOLN
INTRAMUSCULAR | Status: AC
  Filled 2019-10-30: qty 10

## 2019-10-30 MED ORDER — PHENYLEPHRINE HCL-NACL 10-0.9 MG/250ML-% IV SOLN
INTRAVENOUS | Status: DC | PRN
  Administered 2019-10-30: 50 ug/min via INTRAVENOUS

## 2019-10-30 MED ORDER — CHLORHEXIDINE GLUCONATE 4 % EX LIQD: 60.0000 mL | Freq: Once | CUTANEOUS | Status: DC

## 2019-10-30 MED ORDER — SUCCINYLCHOLINE CHLORIDE 200 MG/10ML IV SOSY
PREFILLED_SYRINGE | INTRAVENOUS | Status: AC
  Filled 2019-10-30: qty 10

## 2019-10-30 MED ORDER — STERILE WATER FOR IRRIGATION IR SOLN
Status: DC | PRN
  Administered 2019-10-30: 1000 mL

## 2019-10-30 MED ORDER — FENTANYL CITRATE (PF) 250 MCG/5ML IJ SOLN
INTRAMUSCULAR | Status: DC | PRN
  Administered 2019-10-30: 150 ug via INTRAVENOUS

## 2019-10-30 MED ORDER — GABAPENTIN 300 MG PO CAPS
300.0000 mg | ORAL_CAPSULE | ORAL | Status: AC
  Administered 2019-10-30: 300 mg via ORAL
  Filled 2019-10-30: qty 1

## 2019-10-30 MED ORDER — PROPOFOL 10 MG/ML IV BOLUS
INTRAVENOUS | Status: AC
  Filled 2019-10-30: qty 20

## 2019-10-30 MED ORDER — VASOPRESSIN 20 UNIT/ML IV SOLN
INTRAVENOUS | Status: AC
  Filled 2019-10-30: qty 1

## 2019-10-30 MED ORDER — ALBUMIN HUMAN 5 % IV SOLN: INTRAVENOUS | Status: DC | PRN

## 2019-10-30 MED ORDER — DEXAMETHASONE SODIUM PHOSPHATE 4 MG/ML IJ SOLN: 4.0000 mg | INTRAMUSCULAR | Status: DC

## 2019-10-30 MED ORDER — BUPIVACAINE HCL 0.25 % IJ SOLN
INTRAMUSCULAR | Status: AC
  Filled 2019-10-30: qty 1

## 2019-10-30 MED ORDER — LACTATED RINGERS IV SOLN: INTRAVENOUS | Status: DC

## 2019-10-30 MED ORDER — SCOPOLAMINE 1 MG/3DAYS TD PT72
1.0000 | MEDICATED_PATCH | TRANSDERMAL | Status: DC
  Administered 2019-10-30: 1.5 mg via TRANSDERMAL
  Filled 2019-10-30: qty 1

## 2019-10-30 MED ORDER — PHENYLEPHRINE HCL (PRESSORS) 10 MG/ML IV SOLN
INTRAVENOUS | Status: AC
  Filled 2019-10-30: qty 1

## 2019-10-30 MED ORDER — SODIUM CHLORIDE (PF) 0.9 % IJ SOLN
INTRAMUSCULAR | Status: AC
  Filled 2019-10-30: qty 20

## 2019-10-30 MED ORDER — ONDANSETRON HCL 4 MG/2ML IJ SOLN
INTRAMUSCULAR | Status: DC | PRN
  Administered 2019-10-30: 4 mg via INTRAVENOUS

## 2019-10-30 MED ORDER — LISINOPRIL 20 MG PO TABS
40.0000 mg | ORAL_TABLET | Freq: Every day | ORAL | Status: DC
  Administered 2019-10-30 – 2019-10-31 (×2): 40 mg via ORAL
  Filled 2019-10-30 (×2): qty 2

## 2019-10-30 MED ORDER — BUPIVACAINE LIPOSOME 1.3 % IJ SUSP
INTRAMUSCULAR | Status: DC | PRN
  Administered 2019-10-30: 20 mL

## 2019-10-30 MED ORDER — ACETAMINOPHEN 160 MG/5ML PO SOLN
1000.0000 mg | Freq: Three times a day (TID) | ORAL | Status: DC
  Administered 2019-10-30 – 2019-10-31 (×3): 1000 mg via ORAL
  Filled 2019-10-30 (×3): qty 40.6

## 2019-10-30 MED ORDER — SUGAMMADEX SODIUM 200 MG/2ML IV SOLN
INTRAVENOUS | Status: DC | PRN
  Administered 2019-10-30: 400 mg via INTRAVENOUS

## 2019-10-30 MED ORDER — KETAMINE HCL 10 MG/ML IJ SOLN
INTRAMUSCULAR | Status: AC
  Filled 2019-10-30: qty 1

## 2019-10-30 MED ORDER — LIDOCAINE HCL 2 % IJ SOLN
INTRAMUSCULAR | Status: AC
  Filled 2019-10-30: qty 60

## 2019-10-30 MED ORDER — HYDROMORPHONE HCL 1 MG/ML IJ SOLN
INTRAMUSCULAR | Status: AC
  Filled 2019-10-30: qty 1

## 2019-10-30 MED ORDER — BICTEGRAVIR-EMTRICITAB-TENOFOV 50-200-25 MG PO TABS
1.0000 | ORAL_TABLET | Freq: Every day | ORAL | Status: DC
  Administered 2019-10-30 – 2019-10-31 (×2): 1 via ORAL
  Filled 2019-10-30 (×2): qty 1

## 2019-10-30 MED ORDER — BUPIVACAINE HCL 0.25 % IJ SOLN
INTRAMUSCULAR | Status: DC | PRN
  Administered 2019-10-30: 30 mL

## 2019-10-30 MED ORDER — ONDANSETRON HCL 4 MG/2ML IJ SOLN
INTRAMUSCULAR | Status: AC
  Filled 2019-10-30: qty 2

## 2019-10-30 MED ORDER — HYDROMORPHONE HCL 1 MG/ML IJ SOLN
0.2500 mg | INTRAMUSCULAR | Status: DC | PRN
  Administered 2019-10-30 (×2): 0.5 mg via INTRAVENOUS

## 2019-10-30 MED ORDER — APREPITANT 40 MG PO CAPS
40.0000 mg | ORAL_CAPSULE | ORAL | Status: AC
  Administered 2019-10-30: 40 mg via ORAL
  Filled 2019-10-30: qty 1

## 2019-10-30 MED ORDER — VASOPRESSIN 20 UNIT/ML IV SOLN
INTRAVENOUS | Status: DC | PRN
  Administered 2019-10-30 (×2): 2 [IU] via INTRAVENOUS
  Administered 2019-10-30 (×3): 1 [IU] via INTRAVENOUS

## 2019-10-30 MED ORDER — 0.9 % SODIUM CHLORIDE (POUR BTL) OPTIME
TOPICAL | Status: DC | PRN
  Administered 2019-10-30: 1000 mL

## 2019-10-30 MED ORDER — SUGAMMADEX SODIUM 500 MG/5ML IV SOLN
INTRAVENOUS | Status: AC
  Filled 2019-10-30: qty 5

## 2019-10-30 MED ORDER — MIDAZOLAM HCL 2 MG/2ML IJ SOLN
INTRAMUSCULAR | Status: AC
  Filled 2019-10-30: qty 2

## 2019-10-30 MED ORDER — ALBUMIN HUMAN 5 % IV SOLN
INTRAVENOUS | Status: AC
  Filled 2019-10-30: qty 250

## 2019-10-30 MED ORDER — SUCCINYLCHOLINE CHLORIDE 200 MG/10ML IV SOSY
PREFILLED_SYRINGE | INTRAVENOUS | Status: DC | PRN
  Administered 2019-10-30: 120 mg via INTRAVENOUS

## 2019-10-30 MED ORDER — ENSURE MAX PROTEIN PO LIQD
2.0000 [oz_av] | ORAL | Status: DC
  Administered 2019-10-31 (×4): 2 [oz_av] via ORAL

## 2019-10-30 MED ORDER — MORPHINE SULFATE (PF) 4 MG/ML IV SOLN: 1.0000 mg | INTRAVENOUS | Status: DC | PRN

## 2019-10-30 MED ORDER — SIMETHICONE 80 MG PO CHEW: 80.0000 mg | CHEWABLE_TABLET | Freq: Four times a day (QID) | ORAL | Status: DC | PRN

## 2019-10-30 MED ORDER — ROCURONIUM BROMIDE 10 MG/ML (PF) SYRINGE
PREFILLED_SYRINGE | INTRAVENOUS | Status: AC
  Filled 2019-10-30: qty 10

## 2019-10-30 MED ORDER — LIDOCAINE 20MG/ML (2%) 15 ML SYRINGE OPTIME
INTRAMUSCULAR | Status: DC | PRN
  Administered 2019-10-30 (×2): 1.5 mg/kg/h via INTRAVENOUS

## 2019-10-30 MED ORDER — LIDOCAINE 2% (20 MG/ML) 5 ML SYRINGE
INTRAMUSCULAR | Status: DC | PRN
  Administered 2019-10-30: 60 mg via INTRAVENOUS

## 2019-10-30 MED ORDER — DEXAMETHASONE SODIUM PHOSPHATE 10 MG/ML IJ SOLN
INTRAMUSCULAR | Status: DC | PRN
  Administered 2019-10-30: 10 mg via INTRAVENOUS

## 2019-10-30 MED ORDER — ROCURONIUM BROMIDE 10 MG/ML (PF) SYRINGE
PREFILLED_SYRINGE | INTRAVENOUS | Status: DC | PRN
  Administered 2019-10-30: 60 mg via INTRAVENOUS

## 2019-10-30 MED ORDER — FAMOTIDINE IN NACL 20-0.9 MG/50ML-% IV SOLN
20.0000 mg | Freq: Two times a day (BID) | INTRAVENOUS | Status: DC
  Administered 2019-10-30 – 2019-10-31 (×3): 20 mg via INTRAVENOUS
  Filled 2019-10-30 (×3): qty 50

## 2019-10-30 MED ORDER — ENOXAPARIN SODIUM 30 MG/0.3ML ~~LOC~~ SOLN
30.0000 mg | Freq: Two times a day (BID) | SUBCUTANEOUS | Status: DC
  Administered 2019-10-30 – 2019-10-31 (×2): 30 mg via SUBCUTANEOUS
  Filled 2019-10-30 (×2): qty 0.3

## 2019-10-30 MED ORDER — OXYCODONE HCL 5 MG PO TABS: 5.0000 mg | ORAL_TABLET | Freq: Once | ORAL | Status: DC | PRN

## 2019-10-30 MED ORDER — PROMETHAZINE HCL 25 MG/ML IJ SOLN: 6.2500 mg | INTRAMUSCULAR | Status: DC | PRN

## 2019-10-30 MED ORDER — HYDRALAZINE HCL 50 MG PO TABS: 50.0000 mg | ORAL_TABLET | Freq: Three times a day (TID) | ORAL | Status: DC | PRN

## 2019-10-30 MED ORDER — LACTATED RINGERS IR SOLN
Status: DC | PRN
  Administered 2019-10-30: 1000 mL

## 2019-10-30 MED ORDER — LIDOCAINE 2% (20 MG/ML) 5 ML SYRINGE
INTRAMUSCULAR | Status: AC
  Filled 2019-10-30: qty 5

## 2019-10-30 MED ORDER — KETOROLAC TROMETHAMINE 30 MG/ML IJ SOLN: 30.0000 mg | Freq: Once | INTRAMUSCULAR | Status: DC | PRN

## 2019-10-30 MED ORDER — HEPARIN SODIUM (PORCINE) 5000 UNIT/ML IJ SOLN
5000.0000 [IU] | INTRAMUSCULAR | Status: AC
  Administered 2019-10-30: 5000 [IU] via SUBCUTANEOUS
  Filled 2019-10-30: qty 1

## 2019-10-30 MED ORDER — ACETAMINOPHEN 500 MG PO TABS
1000.0000 mg | ORAL_TABLET | ORAL | Status: AC
  Administered 2019-10-30: 1000 mg via ORAL
  Filled 2019-10-30: qty 2

## 2019-10-30 SURGICAL SUPPLY — 56 items
APPLIER CLIP ROT 13.4 12 LRG (CLIP)
BAG LAPAROSCOPIC 12 15 PORT 16 (BASKET) ×1 IMPLANT
BAG RETRIEVAL 12/15 (BASKET) ×2
BAG RETRIEVAL 12/15MM (BASKET) ×1
BENZOIN TINCTURE PRP APPL 2/3 (GAUZE/BANDAGES/DRESSINGS) ×3 IMPLANT
BLADE SURG SZ11 CARB STEEL (BLADE) ×3 IMPLANT
BNDG ADH 1X3 SHEER STRL LF (GAUZE/BANDAGES/DRESSINGS) ×3 IMPLANT
CABLE HIGH FREQUENCY MONO STRZ (ELECTRODE) IMPLANT
CHLORAPREP W/TINT 26 (MISCELLANEOUS) ×3 IMPLANT
CLIP APPLIE ROT 13.4 12 LRG (CLIP) IMPLANT
CLOSURE WOUND 1/2 X4 (GAUZE/BANDAGES/DRESSINGS) ×1
COVER SURGICAL LIGHT HANDLE (MISCELLANEOUS) ×3 IMPLANT
COVER WAND RF STERILE (DRAPES) IMPLANT
DRAPE UTILITY XL STRL (DRAPES) ×6 IMPLANT
ELECT REM PT RETURN 15FT ADLT (MISCELLANEOUS) ×3 IMPLANT
GAUZE 4X4 16PLY RFD (DISPOSABLE) ×3 IMPLANT
GLOVE BIOGEL PI IND STRL 7.0 (GLOVE) ×1 IMPLANT
GLOVE BIOGEL PI INDICATOR 7.0 (GLOVE) ×2
GLOVE SURG SS PI 7.0 STRL IVOR (GLOVE) ×3 IMPLANT
GOWN STRL REUS W/TWL LRG LVL3 (GOWN DISPOSABLE) ×3 IMPLANT
GOWN STRL REUS W/TWL XL LVL3 (GOWN DISPOSABLE) ×9 IMPLANT
GRASPER SUT TROCAR 14GX15 (MISCELLANEOUS) ×3 IMPLANT
HOVERMATT SINGLE USE (MISCELLANEOUS) IMPLANT
KIT BASIN OR (CUSTOM PROCEDURE TRAY) ×3 IMPLANT
KIT TURNOVER KIT A (KITS) IMPLANT
MARKER SKIN DUAL TIP RULER LAB (MISCELLANEOUS) ×3 IMPLANT
NEEDLE SPNL 22GX3.5 QUINCKE BK (NEEDLE) ×3 IMPLANT
PACK UNIVERSAL I (CUSTOM PROCEDURE TRAY) ×3 IMPLANT
PENCIL SMOKE EVACUATOR (MISCELLANEOUS) IMPLANT
RELOAD STAPLER BLUE 60MM (STAPLE) ×4 IMPLANT
RELOAD STAPLER GOLD 60MM (STAPLE) ×1 IMPLANT
RELOAD STAPLER GREEN 60MM (STAPLE) ×1 IMPLANT
SCISSORS LAP 5X45 EPIX DISP (ENDOMECHANICALS) IMPLANT
SET IRRIG TUBING LAPAROSCOPIC (IRRIGATION / IRRIGATOR) ×3 IMPLANT
SET TUBE SMOKE EVAC HIGH FLOW (TUBING) ×3 IMPLANT
SHEARS HARMONIC ACE PLUS 45CM (MISCELLANEOUS) ×3 IMPLANT
SLEEVE GASTRECTOMY 40FR VISIGI (MISCELLANEOUS) ×3 IMPLANT
SLEEVE XCEL OPT CAN 5 100 (ENDOMECHANICALS) ×6 IMPLANT
SOL ANTI FOG 6CC (MISCELLANEOUS) ×1 IMPLANT
SOLUTION ANTI FOG 6CC (MISCELLANEOUS) ×2
STAPLER ECHELON LONG 60 440 (INSTRUMENTS) ×3 IMPLANT
STAPLER RELOAD BLUE 60MM (STAPLE) ×12
STAPLER RELOAD GOLD 60MM (STAPLE) ×3
STAPLER RELOAD GREEN 60MM (STAPLE) ×3
STRIP CLOSURE SKIN 1/2X4 (GAUZE/BANDAGES/DRESSINGS) ×2 IMPLANT
SUT ETHIBOND 0 36 GRN (SUTURE) IMPLANT
SUT MNCRL AB 4-0 PS2 18 (SUTURE) ×3 IMPLANT
SUT VICRYL 0 TIES 12 18 (SUTURE) ×3 IMPLANT
SYR 20ML LL LF (SYRINGE) ×3 IMPLANT
SYR 50ML LL SCALE MARK (SYRINGE) ×3 IMPLANT
TOWEL OR 17X26 10 PK STRL BLUE (TOWEL DISPOSABLE) ×3 IMPLANT
TOWEL OR NON WOVEN STRL DISP B (DISPOSABLE) ×3 IMPLANT
TROCAR BLADELESS 15MM (ENDOMECHANICALS) ×3 IMPLANT
TROCAR BLADELESS OPT 5 100 (ENDOMECHANICALS) ×3 IMPLANT
TUBING CONNECTING 10 (TUBING) ×2 IMPLANT
TUBING CONNECTING 10' (TUBING) ×1

## 2019-10-30 NOTE — H&P (Signed)
Ethan Hansen is an 34 y.o. male.   Chief Complaint: obesity HPI: 34 yo male with class III obesity with multiple comorbidities. He has completed all requirements and is ready to proceed with bariatric surgery  Past Medical History:  Diagnosis Date  . Chronic kidney disease   . Hemorrhoids   . HIV positive (HCC)   . Seizure disorder [facility])     History reviewed. No pertinent surgical history.  Family History  Problem Relation Age of Onset  . Hypertension Mother   . Hypertension Father   . Diabetes Father    Social History:  reports that he has quit smoking. He has never used smokeless tobacco. He reports current alcohol use. He reports that he does not use drugs.  Allergies:  Allergies  Allergen Reactions  . Bactrim [Sulfamethoxazole-Trimethoprim] Anaphylaxis  . Shellfish Allergy Hives    From contact, did not eat the shellfish    Medications Prior to Admission  Medication Sig Dispense Refill  . bictegravir-emtricitabine-tenofovir AF (BIKTARVY) 50-200-25 MG TABS tablet Take 1 tablet by mouth daily.    . hydrochlorothiazide (HYDRODIURIL) 12.5 MG tablet Take 12.5 mg by mouth daily.    Marland Kitchen lisinopril (ZESTRIL) 40 MG tablet Take 40 mg by mouth daily.    . Multiple Vitamins-Minerals (AIRBORNE PO) Take 1 tablet by mouth daily.    . Multiple Vitamins-Minerals (EMERGEN-C IMMUNE PO) Take 1 tablet by mouth daily.    Marland Kitchen zinc gluconate 50 MG tablet Take 50 mg by mouth daily.      No results found for this or any previous visit (from the past 48 hour(s)). No results found.  Review of Systems  Constitutional: Negative for chills and fever.  HENT: Negative for hearing loss.   Respiratory: Negative for cough.   Cardiovascular: Negative for chest pain and palpitations.  Gastrointestinal: Negative for abdominal pain, nausea and vomiting.  Genitourinary: Negative for dysuria and urgency.  Musculoskeletal: Negative for myalgias and neck pain.  Skin: Negative for rash.  Neurological:  Negative for dizziness and headaches.  Hematological: Does not bruise/bleed easily.  Psychiatric/Behavioral: Negative for suicidal ideas.   Blood pressure (!) 144/93, pulse (!) 123, temperature 98.8 F (37.1 C), temperature source Oral, resp. rate 14, height 5\' 5"  (1.651 m), weight 112.9 kg, SpO2 99 %. Physical Exam  Vitals reviewed. Constitutional: He is oriented to person, place, and time. He appears well-developed and well-nourished.  HENT:  Head: Normocephalic and atraumatic.  Eyes: Pupils are equal, round, and reactive to light. Conjunctivae and EOM are normal.  Cardiovascular: Normal rate and regular rhythm.  Respiratory: Effort normal and breath sounds normal.  GI: Soft. Bowel sounds are normal. He exhibits no distension. There is no abdominal tenderness.  Musculoskeletal:        General: Normal range of motion.     Cervical back: Normal range of motion and neck supple.  Neurological: He is alert and oriented to person, place, and time.  Skin: Skin is warm and dry.  Psychiatric: He has a normal mood and affect. His behavior is normal.    Assessment/Plan 34 yo male with class III obesity -lap sleeve gastrectomy -ERAS protocol -inpatient admission  32, MD 10/30/2019, 7:19 AM

## 2019-10-30 NOTE — Progress Notes (Signed)
Discussed post op day goals with patient including ambulation, IS, diet progression, pain, and nausea control.  BSTOP education provided including BSTOP information guide, "Guide for Pain Management after your Bariatric Procedure".  Questions answered. 

## 2019-10-30 NOTE — Op Note (Signed)
Ethan Hansen [id number] April 14, 1986 10/30/2019  Preoperative diagnosis: severe obesity  Postoperative diagnosis: Same   Procedure: upper endoscopy   Surgeon: Mary Sella. Mirela Parsley M.D., FACS   Anesthesia: Gen.   Indications for procedure: 34 y.o. year old male undergoing Laparoscopic Gastric Sleeve Resection and an EGD was requested to evaluate the new gastric sleeve.   Description of procedure: After we have completed the sleeve resection, I scrubbed out and obtained the Olympus endoscope. I gently placed endoscope in the patient's oropharynx and gently glided it down the esophagus without any difficulty under direct visualization. Once I was in the gastric sleeve, I insufflated the stomach with air. I was able to cannulate and advanced the scope through the gastric sleeve. I was able to cannulate the duodenum with ease. Dr. Sheliah Hatch had placed saline in the upper abdomen. Upon further insufflation of the gastric sleeve there was no evidence of bubbles. GE junction located at 39 cm.  Upon further inspection of the gastric sleeve, the mucosa appeared normal. There is no evidence of any mucosal abnormality. The sleeve was widely patent at the angularis. There was no evidence of bleeding. The gastric sleeve was decompressed. The scope was withdrawn. The patient tolerated this portion of the procedure well. Please see Dr Guerry Minors operative note for details regarding the laparoscopic gastric sleeve resection.   Mary Sella. Andrey Campanile, MD, FACS  General, Bariatric, & Minimally Invasive Surgery  [facility] Surgery, [place]

## 2019-10-30 NOTE — Plan of Care (Signed)
Pt ambulated to bathroom and urinated. Patient then walked to the chair where he sat. Yogi also demonstrated how to use incentive spirometer successfully. Sips of water will be offered at approximately 12 noon.

## 2019-10-30 NOTE — Progress Notes (Signed)
PHARMACY CONSULT FOR:  Risk Assessment for Post-Discharge VTE Following Bariatric Surgery  Post-Discharge VTE Risk Assessment: This patient's probability of 30-day post-discharge VTE is increased due to the factors marked: x  Male     Age >/=60 years    BMI >/=50 kg/m2    CHF    Dyspnea at Rest    Paraplegia  x  Non-gastric-band surgery    Operation Time >/=3 hr    Return to OR     Length of Stay >/= 3 d      Hx of VTE   Hypercoagulable condition   Significant venous stasis   Predicted probability of 30-day post-discharge VTE: 0.31%  Other patient-specific factors to consider:   Recommendation for Discharge: No pharmacologic prophylaxis post-discharge  Ethan Hansen is a 34 y.o. male who underwent  laparoscopic sleeve gastrectomy  on 10/30/19    Case start: 0805 Case end: 0905   Allergies  Allergen Reactions  . Bactrim [Sulfamethoxazole-Trimethoprim] Anaphylaxis  . Shellfish Allergy Hives    From contact, did not eat the shellfish    Patient Measurements: Height: 5\' 5"  (165.1 cm) Weight: 249 lb (112.9 kg) IBW/kg (Calculated) : 61.5 Body mass index is 41.44 kg/m.  Recent Labs    10/30/19 0941  HGB 15.0  HCT 46.4   Estimated Creatinine Clearance: 78.7 mL/min (A) (by C-G formula based on SCr of 1.55 mg/dL (H)).    Past Medical History:  Diagnosis Date  . Chronic kidney disease   . Hemorrhoids   . HIV positive (HCC)   . Seizure disorder (HCC)      Medications Prior to Admission  Medication Sig Dispense Refill Last Dose  . bictegravir-emtricitabine-tenofovir AF (BIKTARVY) 50-200-25 MG TABS tablet Take 1 tablet by mouth daily.   10/28/2019  . hydrochlorothiazide (HYDRODIURIL) 12.5 MG tablet Take 12.5 mg by mouth daily.   10/28/2019  . lisinopril (ZESTRIL) 40 MG tablet Take 40 mg by mouth daily.   10/28/2019  . Multiple Vitamins-Minerals (AIRBORNE PO) Take 1 tablet by mouth daily.   10/26/2019  . Multiple Vitamins-Minerals (EMERGEN-C IMMUNE PO) Take 1  tablet by mouth daily.   10/26/2019  . zinc gluconate 50 MG tablet Take 50 mg by mouth daily.   10/26/2019       12/26/2019 D 10/30/2019,11:47 AM

## 2019-10-30 NOTE — Anesthesia Postprocedure Evaluation (Signed)
Anesthesia Post Note  Patient: Ethan Hansen  Procedure(s) Performed: LAPAROSCOPIC GASTRIC SLEEVE RESECTION, UPPER ENDO, ERAS Pathway (N/A Abdomen)     Patient location during evaluation: PACU Anesthesia Type: General Level of consciousness: awake and alert Pain management: pain level controlled Vital Signs Assessment: post-procedure vital signs reviewed and stable Respiratory status: spontaneous breathing, nonlabored ventilation, respiratory function stable and patient connected to nasal cannula oxygen Cardiovascular status: blood pressure returned to baseline and stable Postop Assessment: no apparent nausea or vomiting Anesthetic complications: no    Last Vitals:  Vitals:   10/30/19 1216 10/30/19 1312  BP: 130/75 126/79  Pulse: 97 97  Resp: 14 16  Temp: 36.6 C 36.9 C  SpO2: 100% 96%    Last Pain:  Vitals:   10/30/19 1312  TempSrc: Oral  PainSc:                  Bethene Hankinson P Liza Czerwinski

## 2019-10-30 NOTE — Op Note (Signed)
Preop Diagnosis: Obesity Class III  Postop Diagnosis: same  Procedure performed: laparoscopic Sleeve Gastrectomy  Assitant: Gaynelle Adu  Indications:  The patient is a 34 y.o. year-old morbidly obese male who has been followed in the Bariatric Clinic as an outpatient. This patient was diagnosed with morbid obesity with a BMI of Body mass index is 41.44 kg/m. and significant co-morbidities including hypertension.  The patient was counseled extensively in the Bariatric Outpatient Clinic and after a thorough explanation of the risks and benefits of surgery (including death from complications, bowel leak, infection such as peritonitis and/or sepsis, internal hernia, bleeding, need for blood transfusion, bowel obstruction, organ failure, pulmonary embolus, deep venous thrombosis, wound infection, incisional hernia, skin breakdown, and others entailed on the consent form) and after a compliant diet and exercise program, the patient was scheduled for an elective laparoscopic sleeve gastrectomy.  Description of Operation:  Following informed consent, the patient was taken to the operating room and placed on the operating table in the supine position.  He had previously received prophylactic antibiotics and subcutaneous heparin for DVT prophylaxis in the pre-op holding area.  After induction of general endotracheal anesthesia by the anesthesiologist, the patient underwent placement of sequential compression devices and an oro-gastric tube.  A timeout was confirmed by the surgery and anesthesia teams.  The patient was adequately padded at all pressure points and placed on a footboard to prevent slippage from the OR table during extremes of position during surgery.  He underwent a routine sterile prep and drape of her entire abdomen.    Next, A transverse incision was made under the left subcostal area and a 49mm optical viewing trocar was introduced into the peritoneal cavity. Pneumoperitoneum was applied with a  high flow and low pressure. A laparoscope was inserted to confirm placement. A extraperitoneal block was then placed at the lateral abdominal wall using exparel diluted with marcaine. 5 additional incisions were placed: 1 57mm trocar to the left of the midline. 1 additional 38mm trocar in the left lateral area, 1 72mm trocar in the right mid abdomen, 1 35mm trocar in the right subcostal area, and a Nathanson retractor was placed through a subxiphoid incision.  The fat pad at the GE junction was incised and the gastrodiaphragmatic ligament was divided using the Harmonic scalpel. Next, a hole was created through the lesser omentum along the greater curve of the stomach to enter the lesser sac. The vessels along the greater omentum were  Then ligated and divided using the Harmonic scalpel moving towards the spleen and then short gastric vessels were ligated and divided in the same fashion to fully mobilize the fundus. he spleen was inspected and one small tear in spleen which was cauterized with good hemostasis. The left crus was identified to ensure completion of the dissection. Next the antrum was measured and dissection continued inferiorly along the greater curve towards the pylorus and stopped 6cm from the pylorus.   A 40Fr ViSiGi dilator was placed into the esophgaus and along the lesser curve of the stomach and placed on suction. 1 non-reinforced 24mm Green load echelon stapler(s) followed by 1 29mm Gold load echelon stapler(s) followed by 4 79mm blue load echelon stapler(s) were used to make the resection along the antrum being sure to stay well away from the angularis by angling the jaws of the stapler towards the greater curve and later completing the resection staying along the ViSiGi and ensuring the fundus was not retained by appropriately retracting it lateral. Air was  inserted through the Seneca to perform a leak test showing no bubbles and a neutral lie of the stomach.  The assistant then went and  performed an upper endoscopy and leak test. No bubbles were seen and the sleeve and antrum distended appropriately. The specimen was then placed in an endocatch bag and removed by the 50mm port. The spleen repair was rechecked. There were 3 areas of bleeding on the staple line which were clipped with good hemostasis. The fascia of the 85mm port was closed with a 0 vicryl by suture passer. Hemostasis was ensured. Pneumoperitoneum was evacuated, all ports were removed and all incisions closed with 4-0 monocryl suture in subcuticular fashion. Steristrips and bandaids were put in place for dressing. The patient awoke from anesthesia and was brought to pacu in stable condition. All counts were correct.  Estimated blood loss: <56ml  Specimens:  Sleeve gastrectomy  Local Anesthesia: 50 ml Exparel:0.5% Marcaine mix  Post-Op Plan:       Pain Management: PO, prn      Antibiotics: Prophylactic      Anticoagulation: Prophylactic, Starting now      Post Op Studies/Consults: Not applicable      Intended Discharge: within 48h      Intended Outpatient Follow-Up: Two Week      Intended Outpatient Studies: Not Applicable      Other: Not Applicable  Images:      Ethan Hansen

## 2019-10-30 NOTE — Transfer of Care (Signed)
Immediate Anesthesia Transfer of Care Note  Patient: Ethan Hansen  Procedure(s) Performed: LAPAROSCOPIC GASTRIC SLEEVE RESECTION, UPPER ENDO, ERAS Pathway (N/A Abdomen)  Patient Location: PACU  Anesthesia Type:General  Level of Consciousness: sedated  Airway & Oxygen Therapy: Patient Spontanous Breathing and Patient connected to face mask oxygen  Post-op Assessment: Report given to RN and Post -op Vital signs reviewed and stable  Post vital signs: Reviewed and stable  Last Vitals:  Vitals Value Taken Time  BP 148/96 10/30/19 0916  Temp    Pulse    Resp 20 10/30/19 0918  SpO2    Vitals shown include unvalidated device data.  Last Pain:  Vitals:   10/30/19 0557  TempSrc:   PainSc: 0-No pain         Complications: No apparent anesthesia complications

## 2019-10-30 NOTE — Anesthesia Procedure Notes (Signed)
Procedure Name: Intubation Date/Time: 10/30/2019 7:38 AM Performed by: Talbot Grumbling, CRNA Pre-anesthesia Checklist: Patient identified, Emergency Drugs available and Patient being monitored Patient Re-evaluated:Patient Re-evaluated prior to induction Oxygen Delivery Method: Circle system utilized Preoxygenation: Pre-oxygenation with 100% oxygen Induction Type: IV induction Ventilation: Mask ventilation without difficulty Laryngoscope Size: Mac and 3 Grade View: Grade I Tube type: Oral Tube size: 8.0 mm Number of attempts: 1 Airway Equipment and Method: Stylet Placement Confirmation: ETT inserted through vocal cords under direct vision,  positive ETCO2 and breath sounds checked- equal and bilateral Secured at: 23 cm Tube secured with: Tape Dental Injury: Teeth and Oropharynx as per pre-operative assessment

## 2019-10-31 LAB — CBC WITH DIFFERENTIAL/PLATELET
Abs Immature Granulocytes: 0.02 10*3/uL (ref 0.00–0.07)
Basophils Absolute: 0 10*3/uL (ref 0.0–0.1)
Basophils Relative: 0 %
Eosinophils Absolute: 0 10*3/uL (ref 0.0–0.5)
Eosinophils Relative: 0 %
HCT: 44.2 % (ref 39.0–52.0)
Hemoglobin: 13.9 g/dL (ref 13.0–17.0)
Immature Granulocytes: 0 %
Lymphocytes Relative: 13 %
Lymphs Abs: 1.2 10*3/uL (ref 0.7–4.0)
MCH: 26.2 pg (ref 26.0–34.0)
MCHC: 31.4 g/dL (ref 30.0–36.0)
MCV: 83.4 fL (ref 80.0–100.0)
Monocytes Absolute: 1.3 10*3/uL — ABNORMAL HIGH (ref 0.1–1.0)
Monocytes Relative: 14 %
Neutro Abs: 6.7 10*3/uL (ref 1.7–7.7)
Neutrophils Relative %: 73 %
Platelets: 273 10*3/uL (ref 150–400)
RBC: 5.3 MIL/uL (ref 4.22–5.81)
RDW: 15.1 % (ref 11.5–15.5)
WBC: 9.2 10*3/uL (ref 4.0–10.5)
nRBC: 0 % (ref 0.0–0.2)

## 2019-10-31 LAB — COMPREHENSIVE METABOLIC PANEL
ALT: 34 U/L (ref 0–44)
AST: 36 U/L (ref 15–41)
Albumin: 4 g/dL (ref 3.5–5.0)
Alkaline Phosphatase: 61 U/L (ref 38–126)
Anion gap: 9 (ref 5–15)
BUN: 13 mg/dL (ref 6–20)
CO2: 22 mmol/L (ref 22–32)
Calcium: 8.8 mg/dL — ABNORMAL LOW (ref 8.9–10.3)
Chloride: 105 mmol/L (ref 98–111)
Creatinine, Ser: 1.42 mg/dL — ABNORMAL HIGH (ref 0.61–1.24)
GFR calc Af Amer: >60 mL/min (ref >60)
GFR calc non Af Amer: >60 mL/min (ref >60)
Glucose, Bld: 120 mg/dL — ABNORMAL HIGH (ref 70–99)
Potassium: 4.4 mmol/L (ref 3.5–5.1)
Sodium: 136 mmol/L (ref 135–145)
Total Bilirubin: 1.1 mg/dL (ref 0.3–1.2)
Total Protein: 7.5 g/dL (ref 6.5–8.1)

## 2019-10-31 MED ORDER — ONDANSETRON 4 MG PO TBDP: 4.0000 mg | ORAL_TABLET | Freq: Four times a day (QID) | ORAL | 0 refills | Status: AC | PRN

## 2019-10-31 MED ORDER — PANTOPRAZOLE SODIUM 40 MG PO TBEC: 40.0000 mg | DELAYED_RELEASE_TABLET | Freq: Every day | ORAL | 0 refills | Status: AC

## 2019-10-31 MED ORDER — GABAPENTIN 100 MG PO CAPS: 200.0000 mg | ORAL_CAPSULE | Freq: Two times a day (BID) | ORAL | 0 refills | Status: AC

## 2019-10-31 MED ORDER — ACETAMINOPHEN 500 MG PO TABS: 1000.0000 mg | ORAL_TABLET | Freq: Three times a day (TID) | ORAL | 0 refills | Status: AC

## 2019-10-31 MED ORDER — OXYCODONE HCL 5 MG PO TABS: 5.0000 mg | ORAL_TABLET | Freq: Four times a day (QID) | ORAL | 0 refills | Status: AC | PRN

## 2019-10-31 NOTE — Discharge Summary (Signed)
Physician Discharge Summary  Ethan Hansen DQQ:[id number] DOB: [birth year] DOA: 10/30/2019  PCP: Patient, No Pcp Per  Admit date: 10/30/2019 Discharge date: 10/31/2019  Recommendations for Outpatient Follow-up:  1.  (include homehealth, outpatient follow-up instructions, specific recommendations for PCP to follow-up on, etc.)   Discharge Diagnoses:  Active Problems:   Morbid obesity [facility])   Surgical Procedure: laparoscopic sleeve gastrectomy, upper endoscopy  Discharge Condition: Good Disposition: Home  Diet recommendation: Postoperative sleeve gastrectomy diet (liquids only)  Filed Weights   10/30/19 0530 10/30/19 0536  Weight: 114.9 kg 112.9 kg     Hospital Course:  The patient was admitted after undergoing laparoscopic sleeve gastrectomy. POD 0 he ambulated well. POD 1 he was started on the water diet protocol and tolerated 600 ml in the first shift. Once meeting the water amount he was advanced to bariatric protein shakes which they tolerated and were discharged home POD 1.  Treatments: surgery: laparoscopic sleeve gastrectomy  Discharge Instructions  Discharge Instructions    Ambulate hourly while awake   Complete by: As directed    Call MD for:  difficulty breathing, headache or visual disturbances   Complete by: As directed    Call MD for:  persistant dizziness or light-headedness   Complete by: As directed    Call MD for:  persistant nausea and vomiting   Complete by: As directed    Call MD for:  redness, tenderness, or signs of infection (pain, swelling, redness, odor or green/yellow discharge around incision site)   Complete by: As directed    Call MD for:  severe uncontrolled pain   Complete by: As directed    Call MD for:  temperature >101 F   Complete by: As directed    Diet bariatric full liquid   Complete by: As directed    Discharge wound care:   Complete by: As directed    Remove Bandaids tomorrow, ok to shower tomorrow. Steristrips may fall off in  1-3 weeks.   Incentive spirometry   Complete by: As directed    Perform hourly while awake     Allergies as of 10/31/2019      Reactions   Bactrim [sulfamethoxazole-trimethoprim] Anaphylaxis   Shellfish Allergy Hives   From contact, did not eat the shellfish      Medication List    STOP taking these medications   hydrochlorothiazide 12.5 MG tablet Commonly known as: HYDRODIURIL     TAKE these medications   acetaminophen 500 MG tablet Commonly known as: TYLENOL Take 2 tablets (1,000 mg total) by mouth every 8 (eight) hours for 5 days.   AIRBORNE PO Take 1 tablet by mouth daily.   EMERGEN-C IMMUNE PO Take 1 tablet by mouth daily.   Biktarvy 50-200-25 MG Tabs tablet Generic drug: bictegravir-emtricitabine-tenofovir AF Take 1 tablet by mouth daily.   gabapentin 100 MG capsule Commonly known as: NEURONTIN Take 2 capsules (200 mg total) by mouth every 12 (twelve) hours.   lisinopril 40 MG tablet Commonly known as: ZESTRIL Take 40 mg by mouth daily.   ondansetron 4 MG disintegrating tablet Commonly known as: ZOFRAN-ODT Take 1 tablet (4 mg total) by mouth every 6 (six) hours as needed for nausea or vomiting.   oxyCODONE 5 MG immediate release tablet Commonly known as: Oxy IR/ROXICODONE Take 1 tablet (5 mg total) by mouth every 6 (six) hours as needed for severe pain.   pantoprazole 40 MG tablet Commonly known as: PROTONIX Take 1 tablet (40 mg total) by mouth daily.  zinc gluconate 50 MG tablet Take 50 mg by mouth daily.            Discharge Care Instructions  (From admission, onward)         Start     Ordered   10/31/19 0000  Discharge wound care:    Comments: Remove Bandaids tomorrow, ok to shower tomorrow. Steristrips may fall off in 1-3 weeks.   10/31/19 0729            The results of significant diagnostics from this hospitalization (including imaging, microbiology, ancillary and laboratory) are listed below for reference.    Significant  Diagnostic Studies: No results found.  Labs: Basic Metabolic Panel: Recent Labs  Lab 10/31/19 0346  NA 136  K 4.4  CL 105  CO2 22  GLUCOSE 120*  BUN 13  CREATININE 1.42*  CALCIUM 8.8*   Liver Function Tests: Recent Labs  Lab 10/31/19 0346  AST 36  ALT 34  ALKPHOS 61  BILITOT 1.1  PROT 7.5  ALBUMIN 4.0    CBC: Recent Labs  Lab 10/30/19 0941 10/31/19 0346  WBC  --  9.2  NEUTROABS  --  6.7  HGB 15.0 13.9  HCT 46.4 44.2  MCV  --  83.4  PLT  --  273    CBG: No results for input(s): GLUCAP in the last 168 hours.  Active Problems:   Morbid obesity (HCC)   VTE plan: no chemical prophylaxis recommended ([email or web address])  Time coordinating discharge: 15 min

## 2019-10-31 NOTE — Progress Notes (Signed)
Discharge instructions given to pt and all questions were answered.  

## 2019-10-31 NOTE — Discharge Instructions (Signed)
° ° ° °GASTRIC BYPASS/SLEEVE ° Home Care Instructions ° ° These instructions are to help you care for yourself when you go home. ° °Call: If you have any problems. °• Call [phone] and ask for the surgeon on call °• If you need immediate help, come to the ER at [facility].  °• Tell the ER staff that you are a new post-op gastric bypass or gastric sleeve patient °  °Signs and symptoms to report: • Severe vomiting or nausea °o If you cannot keep down clear liquids for longer than 1 day, call your surgeon  °• Abdominal pain that does not get better after taking your pain medication °• Fever over 100.4° F with chills °• Heart beating over 100 beats a minute °• Shortness of breath at rest °• Chest pain °•  Redness, swelling, drainage, or foul odor at incision (surgical) sites °•  If your incisions open or pull apart °• Swelling or pain in calf (lower leg) °• Diarrhea (Loose bowel movements that happen often), frequent watery, uncontrolled bowel movements °• Constipation, (no bowel movements for 3 days) if this happens: Pick one °o Milk of Magnesia, 2 tablespoons by mouth, 3 times a day for 2 days if needed °o Stop taking Milk of Magnesia once you have a bowel movement °o Call your doctor if constipation continues °Or °o Miralax  (instead of Milk of Magnesia) following the label instructions °o Stop taking Miralax once you have a bowel movement °o Call your doctor if constipation continues °• Anything you think is not normal °  °Normal side effects after surgery: • Unable to sleep at night or unable to focus °• Irritability or moody °• Being tearful (crying) or depressed °These are common complaints, possibly related to your anesthesia medications that put you to sleep, stress of surgery, and change in lifestyle.  This usually goes away a few weeks after surgery.  If these feelings continue, call your primary care doctor. °  °Wound Care: You may have surgical glue, steri-strips, or staples over your incisions after  surgery °• Surgical glue:  Looks like a clear film over your incisions and will wear off a little at a time °• Steri-strips: Strips of tape over your incisions. You may notice a yellowish color on the skin under the steri-strips. This is used to make the   steri-strips stick better. Do not pull the steri-strips off - let them fall off °• Staples: Staples may be removed before you leave the hospital °o If you go home with staples, call [facility], [phone] at for an appointment with your surgeon’s nurse to have staples removed 10 days after surgery. °• Showering: You may shower two (2) days after your surgery unless your surgeon tells you differently °o Wash gently around incisions with warm soapy water, rinse well, and gently pat dry  °o No tub baths until staples are removed, steri-strips fall off or glue is gone.  °  °Medications: • Medications should be liquid or crushed if larger than the size of a dime °• Extended release pills (medication that release a little bit at a time through the day) should NOT be crushed or cut. (examples include XL, ER, DR, SR) °• Depending on the size and number of medications you take, you may need to space (take a few throughout the day)/change the time you take your medications so that you do not over-fill your pouch (smaller stomach) °• Make sure you follow-up with your primary care doctor to   make medication changes needed during rapid weight loss and life-style changes °• If you have diabetes, follow up with the doctor that orders your diabetes medication(s) within one week after surgery and check your blood sugar regularly. °• Do not drive while taking prescription pain medication  °• It is ok to take Tylenol by the bottle instructions with your pain medicine or instead of your pain medicine as needed.  DO NOT TAKE NSAIDS (EXAMPLES OF NSAIDS:  IBUPROFREN/ NAPROXEN)  °Diet:                    First 2 Weeks ° You will see the dietician t about two (2) weeks  after your surgery. The dietician will increase the types of foods you can eat if you are handling liquids well: °• If you have severe vomiting or nausea and cannot keep down clear liquids lasting longer than 1 day, call your surgeon @ ([phone]) °Protein Shake °• Drink at least 2 ounces of shake 5-6 times per day °• Each serving of protein shakes (usually 8 - 12 ounces) should have: °o 15 grams of protein  °o And no more than 5 grams of carbohydrate  °• Goal for protein each day: °o Men = 80 grams per day °o Women = 60 grams per day °• Protein powder may be added to fluids such as non-fat milk or Lactaid milk or unsweetened Soy/Almond milk (limit to 35 grams added protein powder per serving) ° °Hydration °• Slowly increase the amount of water and other clear liquids as tolerated (See Acceptable Fluids) °• Slowly increase the amount of protein shake as tolerated  °•  Sip fluids slowly and throughout the day.  Do not use straws. °• May use sugar substitutes in small amounts (no more than 6 - 8 packets per day; i.e. Splenda) ° °Fluid Goal °• The first goal is to drink at least 8 ounces of protein shake/drink per day (or as directed by the nutritionist); some examples of protein shakes are Syntrax Nectar, Adkins Advantage, EAS Edge HP, and Unjury. See handout from pre-op Bariatric Education Class: °o Slowly increase the amount of protein shake you drink as tolerated °o You may find it easier to slowly sip shakes throughout the day °o It is important to get your proteins in first °• Your fluid goal is to drink 64 - 100 ounces of fluid daily °o It may take a few weeks to build up to this °• 32 oz (or more) should be clear liquids  °And  °• 32 oz (or more) should be full liquids (see below for examples) °• Liquids should not contain sugar, caffeine, or carbonation ° °Clear Liquids: °• Water or Sugar-free flavored water (i.e. Fruit H2O, Propel) °• Decaffeinated coffee or tea (sugar-free) °• Crystal Lite, Wyler’s Lite,  Minute Maid Lite °• Sugar-free Jell-O °• Bouillon or broth °• Sugar-free Popsicle:   *Less than 20 calories each; Limit 1 per day ° °Full Liquids: °Protein Shakes/Drinks + 2 choices per day of other full liquids °• Full liquids must be: °o No More Than 15 grams of Carbs per serving  °o No More Than 3 grams of Fat per serving °• Strained low-fat cream soup (except Cream of Potato or Tomato) °• Non-Fat milk °• Fat-free Lactaid Milk °• Unsweetened Soy Or Unsweetened Almond Milk °• Low Sugar yogurt (Dannon Lite & Fit, Greek yogurt; Oikos Triple Zero; Chobani Simply 100; Yoplait 100 calorie Greek - No Fruit on the Bottom) ° °  °Vitamins   and Minerals • Start 1 day after surgery unless otherwise directed by your surgeon °• 2 Chewable Bariatric Specific Multivitamin / Multimineral Supplement with iron (Example: Bariatric Advantage Multi EA) °• Chewable Calcium with Vitamin D-3 °(Example: 3 Chewable Calcium Plus 600 with Vitamin D-3) °o Take 500 mg three (3) times a day for a total of 1500 mg each day °o Do not take all 3 doses of calcium at one time as it may cause constipation, and you can only absorb 500 mg  at a time  °o Do not mix multivitamins containing iron with calcium supplements; take 2 hours apart °• Menstruating women and those with a history of anemia (a blood disease that causes weakness) may need extra iron °o Talk with your doctor to see if you need more iron °• Do not stop taking or change any vitamins or minerals until you talk to your dietitian or surgeon °• Your Dietitian and/or surgeon must approve all vitamin and mineral supplements °  °Activity and Exercise: Limit your physical activity as instructed by your doctor.  It is important to continue walking at home.  During this time, use these guidelines: °• Do not lift anything greater than ten (10) pounds for at least two (2) weeks °• Do not go back to work or drive until your surgeon says you can °• You may have sex when you feel comfortable  °o It is  VERY important for male patients to use a reliable birth control method; fertility often increases after surgery  °o All hormonal birth control will be ineffective for 30 days after surgery due to medications given during surgery a barrier method must be used. °o Do not get pregnant for at least 18 months °• Start exercising as soon as your doctor tells you that you can °o Make sure your doctor approves any physical activity °• Start with a simple walking program °• Walk 5-15 minutes each day, 7 days per week.  °• Slowly increase until you are walking 30-45 minutes per day °Consider joining our BELT program. [phone] or email [email or web address] °  °Special Instructions Things to remember: °• Use your CPAP when sleeping if this applies to you ° °• [facility] has two free Bariatric Surgery Support Groups that meet monthly °o The 3rd Thursday of each month, 6 pm, [employer] [facility]  °o The 2nd Friday of each month, 11:45 am in the private dining room in the basement of [place] °• It is very important to keep all follow up appointments with your surgeon, dietitian, primary care physician, and behavioral health practitioner °• Routine follow up schedule with your surgeon include appointments at 2-3 weeks, 6-8 weeks, 6 months, and 1 year at a minimum.  Your surgeon may request to see you more often.   °o After the first year, please follow up with your bariatric surgeon and dietitian at least once a year in order to maintain best weight loss results °[facility]: [phone] °[facility]: [phone] °Bariatric Nurse Coordinator: [phone] °  °   Reviewed and Endorsed  °by [facility] Patient Education Committee, June, 2016 °Edits Approved: Aug, 2018 ° ° ° °

## 2019-10-31 NOTE — Progress Notes (Signed)
Patient alert and oriented, pain is controlled. Patient is tolerating fluids, advanced to protein shake today, patient is tolerating well.  Reviewed Gastric sleeve discharge instructions with patient and patient is able to articulate understanding.  Provided information on BELT program, Support Group and [facility] outpatient pharmacy. All questions answered, will continue to monitor.  Total fluid intake 690 Per dehydration protocol call back one week post op 

## 2019-11-05 ENCOUNTER — Telehealth (HOSPITAL_COMMUNITY): Payer: Self-pay

## 2019-11-05 NOTE — Telephone Encounter (Signed)
Patient called to discuss post bariatric surgery follow up questions.  See below:   1.  Tell me about your pain and pain management?denies  2.  Let's talk about fluid intake.  How much total fluid are you taking in?64 ounces  3.  How much protein have you taken in the last 2 days?90 grams  4.  Have you had nausea?  Tell me about when have experienced nausea and what you did to help?denies  5.  Has the frequency or color changed with your urine?darker urine  6.  Tell me what your incisions look like?no problems steri  7.  Have you been passing gas? BM?bm twice per day  8.  If a problem or question were to arise who would you call?  Do you know contact numbers for [facility], [facility], and [facility]?aware of how to contact all   9.  How has the walking going?up moving  10.  How are your vitamins and calcium going?  How are you taking them?mvi and calcium

## 2019-11-13 ENCOUNTER — Encounter: Payer: Commercial Managed Care - PPO | Attending: General Surgery | Admitting: Skilled Nursing Facility1

## 2019-11-13 ENCOUNTER — Other Ambulatory Visit: Payer: Self-pay

## 2019-11-13 NOTE — Progress Notes (Signed)
2 Week Post-Operative Nutrition Class   Patient was seen on 10/17/18 for Post-Operative Nutrition education at [facility].    Surgery date: 10/30/2019 Surgery type: sleeve Start weight at [facility]: 246.5 Weight today: 232   Body Composition Scale 11/13/2019  Total Body Fat % 32.1  Visceral Fat 21  Fat-Free Mass % 67.8   Total Body Water % 48.8   Muscle-Mass lbs 43.6  Body Fat Displacement          Torso  lbs 46.1         Left Leg  lbs 9.2         Right Leg  lbs 9.2         Left Arm  lbs 4.6         Right Arm   lbs 4.6     The following the learning objectives were met by the patient during this course:  Identifies Phase 3 (Soft, High Proteins) Dietary Goals and will begin from 2 weeks post-operatively to 2 months post-operatively  Identifies appropriate sources of fluids and proteins   States protein recommendations and appropriate sources post-operatively  Identifies the need for appropriate texture modifications, mastication, and bite sizes when consuming solids  Identifies appropriate multivitamin and calcium sources post-operatively  Describes the need for physical activity post-operatively and will follow MD recommendations  States when to call healthcare provider regarding medication questions or post-operative complications   Handouts given during class include:  Phase 3A: Soft, High Protein Diet Handout   Follow-Up Plan: Patient will follow-up at [facility] in 6 weeks for 2 month post-op nutrition visit for diet advancement per MD.

## 2019-11-19 ENCOUNTER — Telehealth: Payer: Self-pay | Admitting: Skilled Nursing Facility1

## 2019-11-19 NOTE — Telephone Encounter (Signed)
RD called pt to verify fluid intake once starting soft, solid proteins 2 week post-bariatric surgery.   Daily Fluid intake: 64+ Daily Protein intake: 80+  Concerns/issues:   None Reported  

## 2019-12-24 ENCOUNTER — Encounter: Payer: Self-pay | Admitting: Dietician

## 2019-12-24 ENCOUNTER — Encounter: Payer: Commercial Managed Care - PPO | Attending: General Surgery | Admitting: Dietician

## 2019-12-24 ENCOUNTER — Other Ambulatory Visit: Payer: Self-pay

## 2019-12-24 DIAGNOSIS — E669 Obesity, unspecified: Secondary | ICD-10-CM

## 2019-12-24 NOTE — Patient Instructions (Signed)

## 2019-12-24 NOTE — Progress Notes (Signed)
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 Date: 10/30/2019  Pt's Expectations of Surgery/ Goals: to lower blood pressure, lose weight, and prevent future health problems  Pt Reported Successes: weight loss, enjoying eating   NUTRITION ASSESSMENT  Anthropometrics  Start weight at [facility]: 246.5 lbs (date: 06/20/2019) Today's weight: 211.5 lbs  Body Composition Scale 11/13/2019 12/24/2019  Weight  lbs 232 211.5  BMI 38.6 34.8  Total Body Fat  % 32.1 28.9     Visceral Fat 21 18  Fat-Free Mass  % 67.8 71     Total Body Water  % 48.8 52     Muscle-Mass  lbs 43.6 39.7  Body Fat Displacement --- ---         Torso  lbs 46.1 37.8         Left Leg  lbs 9.2 7.5         Right Leg  lbs 9.2 7.5         Left Arm  lbs 4.6 3.7         Right Arm  lbs 4.6 3.7    Lifestyle & Dietary Hx Patient states he is proud of his weight loss thus far. States his blood pressure is still high, but anticipates it getting better as he continues to lose weight. Meeting daily protein goal, getting close to daily fluid goal. Does Zumba throughout the week. States his urine is still very dark, and has spoken with his surgeon and PCP about this.    24-Hr Dietary Recall First Meal: sausage + 2 bacon + square of cheese + part of an egg Snack: -  Second Meal: pinto beans + salsa + [employer] chips Snack: -  Third Meal: steak um + cheese Snack: - Beverages: water, protein water  Estimated daily fluid intake: 64 oz Estimated daily protein intake: 80 g Supplements: bariatric MVI + calcium  Current average weekly physical activity: Zumba    Post-Op Goals/ Signs/ Symptoms Using straws: no  Drinking while eating: no  Chewing/swallowing difficulties: no Changes in vision: no Changes to mood/headaches: no Hair loss/changes to skin/nails: no Difficulty focusing/concentrating: no Sweating:  no Dizziness/lightheadedness: no Palpitations: no  Carbonated/caffeinated beverages: no N/V/D/C/Gas: constipation (takes Miralax)  Abdominal pain: no Dumping syndrome: no   NUTRITION DIAGNOSIS  Overweight/obesity ([place]-3.3) related to past poor dietary habits and physical inactivity as evidenced by completed bariatric surgery and following dietary guidelines for continued weight loss and healthy nutrition status.   NUTRITION INTERVENTION Nutrition counseling (C-1) and education (E-2) to facilitate bariatric surgery goals, including: . Diet advancement to the next phase (phase 4) now including non-starchy vegetables  . The importance of consuming adequate calories as well as certain nutrients daily due to the body's need for essential vitamins, minerals, and fats . The importance of daily physical activity and to reach a goal of at least 150 minutes of moderate to vigorous physical activity weekly (or as directed by their physician) due to benefits such as increased musculature and improved lab values  Handouts Provided Include   Phase 4: Protein + Non-Starchy Vegetables   Learning Style & Readiness for Change Teaching method utilized: Visual & Auditory  Demonstrated degree of understanding via: Teach Back  Barriers to learning/adherence to lifestyle change: None Identified    MONITORING & EVALUATION Dietary intake, weekly physical activity, body weight, and goals  in 4 months.  Next Steps Patient is  to follow-up in 4 months for 6 month post-op follow-up.

## 2020-05-06 ENCOUNTER — Ambulatory Visit: Payer: Commercial Managed Care - PPO

## 2021-05-21 ENCOUNTER — Encounter (HOSPITAL_COMMUNITY): Payer: Self-pay | Admitting: *Deleted

## 2021-07-06 IMAGING — CR DG CHEST 2V
2 series · 2 of 2 positions shown · non-contrast
Comparison: None.

CLINICAL DATA: Preop for bariatric surgery.

EXAM:
CHEST - 2 VIEW

[w chest pa]
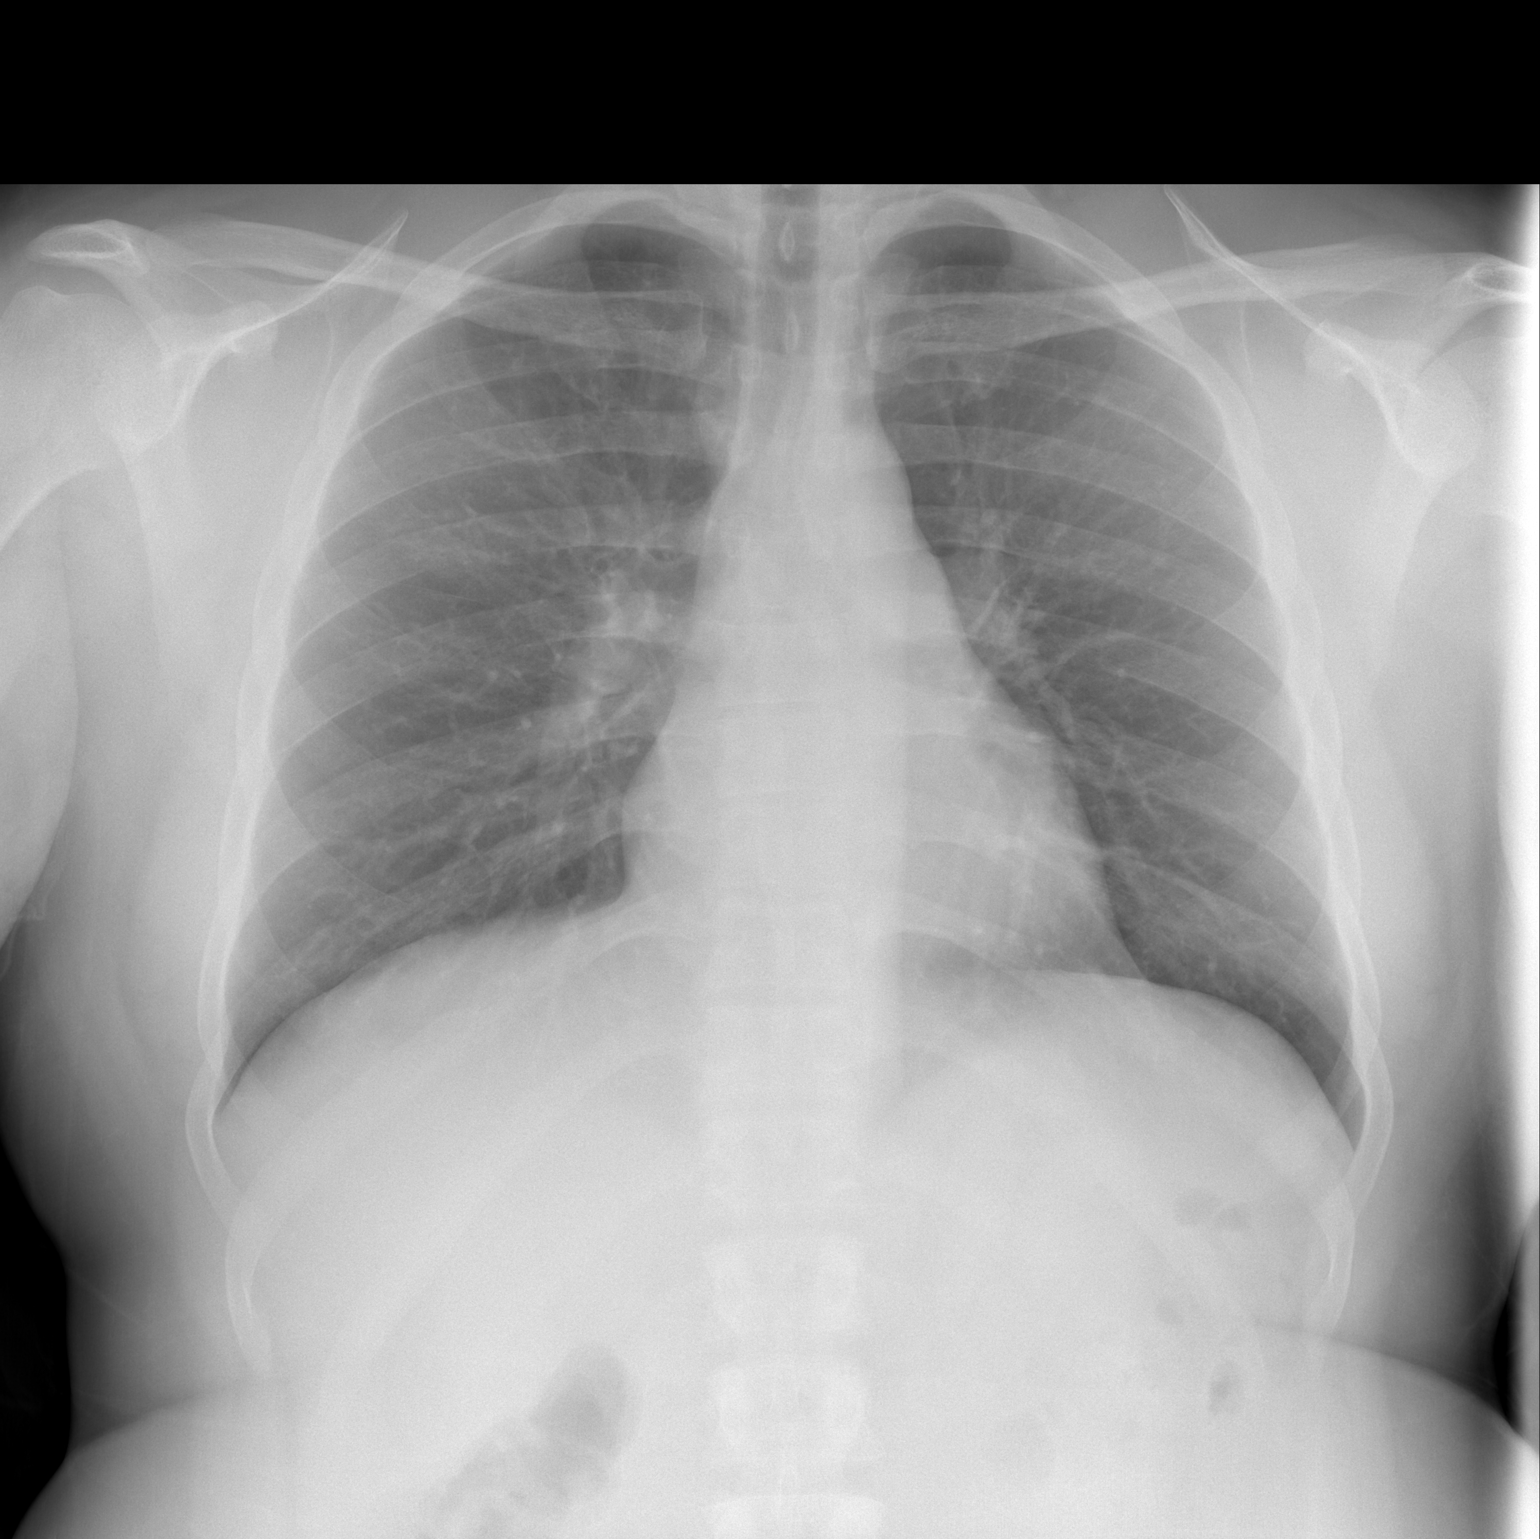

[w chest lat]
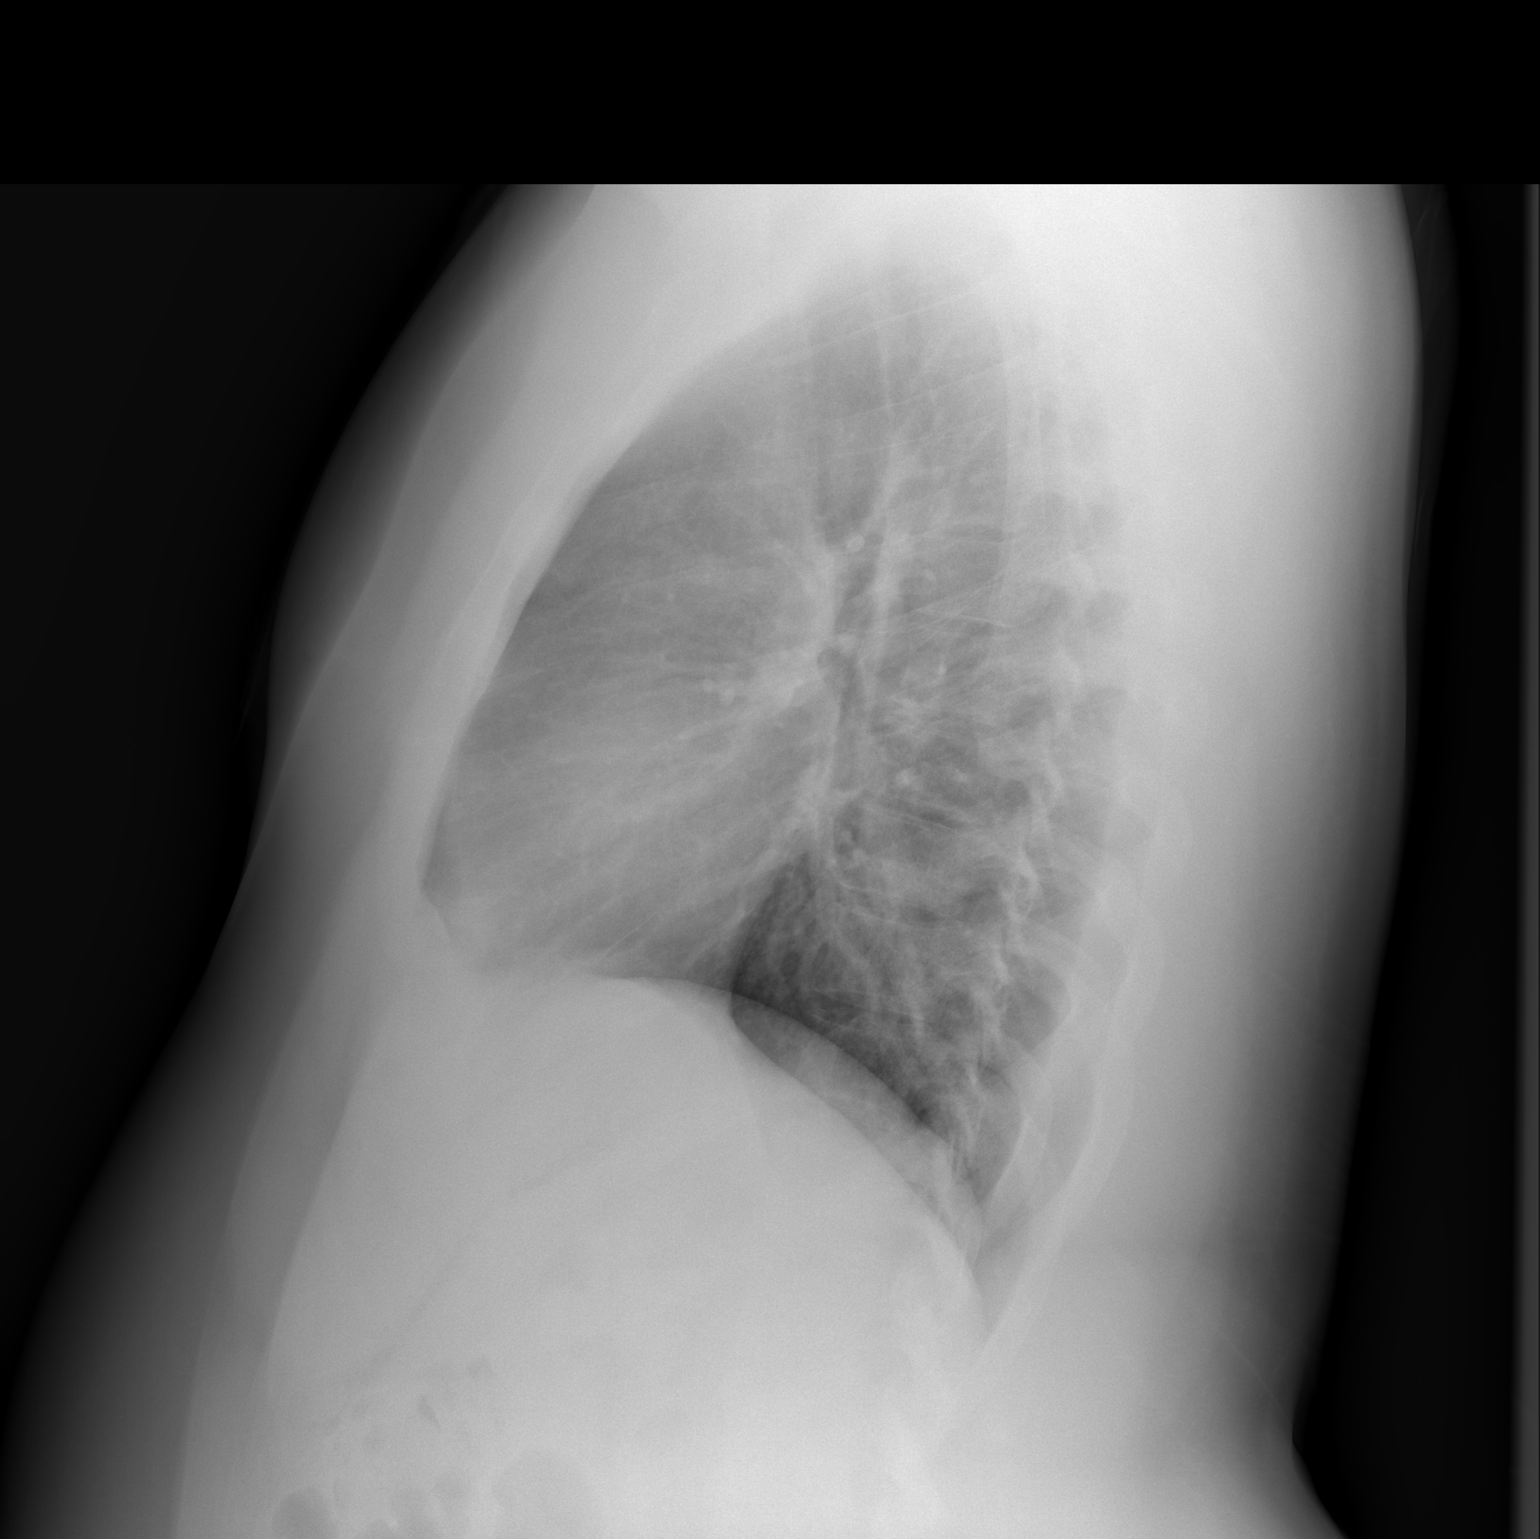

[2 of 2 positions shown; findings below may reference images not displayed]

FINDINGS: The heart size and mediastinal contours are within normal limits.
Both lungs are clear. The visualized skeletal structures are
unremarkable.
IMPRESSION: No active cardiopulmonary disease.

## 2022-05-21 ENCOUNTER — Encounter (HOSPITAL_COMMUNITY): Payer: Self-pay | Admitting: *Deleted

## 2023-06-02 ENCOUNTER — Encounter (HOSPITAL_COMMUNITY): Payer: Self-pay | Admitting: *Deleted
